# Patient Record
Sex: Female | Born: 1965 | Race: White | Hispanic: No
Health system: Midwestern US, Community
[De-identification: ages and names within clinical notes are randomized; demographics above are authoritative.]

---

## 2016-06-13 MED ORDER — CIPROFLOXACIN IN 5 % DEXTROSE 400 MG/200 ML IV PGBK
400 mg | Freq: Two times a day (BID) | INTRAVENOUS | 0 refills | Status: DC
Start: 2016-06-13 — End: 2016-06-14

## 2016-06-13 MED ORDER — DICYCLOMINE 10 MG/ML IM SOLN
20 mg | Freq: Once | INTRAMUSCULAR | 0 refills | Status: CP
Start: 2016-06-13 — End: ?

## 2016-06-13 MED ORDER — FENTANYL CITRATE (PF) 50 MCG/ML IJ SOLN
50 ug | Freq: Once | INTRAVENOUS | 0 refills | Status: CP
Start: 2016-06-13 — End: ?

## 2016-06-13 MED ORDER — LEVOFLOXACIN IN D5W 750 MG/150 ML IV PGBK
750 mg | INTRAVENOUS | 0 refills | Status: CN
Start: 2016-06-13 — End: ?

## 2016-06-13 MED ORDER — LACTATED RINGERS IV SOLP
1000 mL | Freq: Once | INTRAVENOUS | 0 refills | Status: CP
Start: 2016-06-13 — End: ?

## 2016-06-13 MED ORDER — MORPHINE 2 MG/ML IV CRTG
2 mg | Freq: Once | INTRAVENOUS | 0 refills | Status: CP
Start: 2016-06-13 — End: ?

## 2016-06-13 MED ORDER — SODIUM CHLORIDE 0.9 % IJ SOLN
50 mL | Freq: Once | INTRAVENOUS | 0 refills | Status: CP
Start: 2016-06-13 — End: ?

## 2016-06-13 MED ORDER — IOPAMIDOL 76 % IV SOLN
100 mL | Freq: Once | INTRAVENOUS | 0 refills | Status: CP
Start: 2016-06-13 — End: ?

## 2016-06-13 MED ORDER — SIMVASTATIN 40 MG PO TAB
40 mg | Freq: Every evening | ORAL | 0 refills | Status: DC
Start: 2016-06-13 — End: 2016-06-14

## 2016-06-13 MED ORDER — PANTOPRAZOLE 40 MG PO TBEC
40 mg | Freq: Every day | ORAL | 0 refills | Status: DC
Start: 2016-06-13 — End: 2016-06-14

## 2016-06-13 MED ORDER — METRONIDAZOLE IN NACL (ISO-OS) 500 MG/100 ML IV PGBK
500 mg | Freq: Once | INTRAVENOUS | 0 refills | Status: CP
Start: 2016-06-13 — End: ?

## 2016-06-13 MED ORDER — SODIUM CHLORIDE 0.9 % IV SOLP
1000 mL | Freq: Once | INTRAVENOUS | 0 refills | Status: CP
Start: 2016-06-13 — End: ?

## 2016-06-13 MED ORDER — CIPROFLOXACIN HCL 500 MG PO TAB
500 mg | Freq: Two times a day (BID) | ORAL | 0 refills | Status: DC
Start: 2016-06-13 — End: 2016-06-13

## 2016-06-13 MED ORDER — FERROUS SULFATE 325 MG (65 MG IRON) PO TAB
325 mg | Freq: Every day | ORAL | 0 refills | Status: DC
Start: 2016-06-13 — End: 2016-06-14

## 2016-06-13 MED ORDER — ONDANSETRON HCL (PF) 4 MG/2 ML IJ SOLN
4 mg | INTRAVENOUS | 0 refills | Status: DC | PRN
Start: 2016-06-13 — End: 2016-06-14

## 2016-06-13 MED ORDER — METRONIDAZOLE IN NACL (ISO-OS) 500 MG/100 ML IV PGBK
500 mg | INTRAVENOUS | 0 refills | Status: DC
Start: 2016-06-13 — End: 2016-06-14

## 2016-06-13 MED ORDER — FAMOTIDINE 20 MG PO TAB
20 mg | Freq: Two times a day (BID) | ORAL | 0 refills | Status: DC
Start: 2016-06-13 — End: 2016-06-13

## 2016-06-13 MED ORDER — DULOXETINE 60 MG PO CPDR
120 mg | Freq: Every day | ORAL | 0 refills | Status: DC
Start: 2016-06-13 — End: 2016-06-14

## 2016-06-13 MED ORDER — CIPROFLOXACIN HCL 500 MG PO TAB
500 mg | Freq: Once | ORAL | 0 refills | Status: CP
Start: 2016-06-13 — End: ?

## 2016-06-13 MED ORDER — MORPHINE 2 MG/ML IV CRTG
1-2 mg | INTRAVENOUS | 0 refills | Status: DC | PRN
Start: 2016-06-13 — End: 2016-06-14

## 2016-06-14 MED ORDER — POLYETHYLENE GLYCOL 3350 17 GRAM/DOSE PO POWD
17 g | Freq: Every day | ORAL | 0 refills | Status: CN | PRN
Start: 2016-06-14 — End: ?

## 2016-06-14 MED ORDER — PEG-ELECTROLYTE SOLN 420 GRAM PO SOLR
4 L | Freq: Once | ORAL | 0 refills | Status: DC
Start: 2016-06-14 — End: 2016-06-14

## 2016-06-14 MED ORDER — TRAMADOL 50 MG PO TAB
50 mg | ORAL | 0 refills | Status: DC | PRN
Start: 2016-06-14 — End: 2016-06-14

## 2016-08-04 ENCOUNTER — Inpatient Hospital Stay
Admit: 2016-08-04 | Discharge: 2016-08-05 | Discharge status: Against Medical Advice | Payer: Commercial Managed Care - PPO

## 2016-08-04 DIAGNOSIS — R1032 Left lower quadrant pain: Secondary | ICD-10-CM

## 2016-08-04 DIAGNOSIS — K921 Melena: Principal | ICD-10-CM

## 2016-08-04 DIAGNOSIS — K51919 Ulcerative colitis, unspecified with unspecified complications: ICD-10-CM

## 2016-08-04 MED ORDER — ALPRAZOLAM 1 MG PO TAB
1 mg | Freq: Two times a day (BID) | ORAL | 0 refills | Status: DC | PRN
Start: 2016-08-04 — End: 2016-08-05
  Administered 2016-08-05: 13:00:00 1 mg via ORAL

## 2016-08-04 MED ORDER — DICYCLOMINE 20 MG PO TAB
20 mg | ORAL | 0 refills | Status: DC
Start: 2016-08-04 — End: 2016-08-05
  Administered 2016-08-05 (×2): 20 mg via ORAL

## 2016-08-04 MED ORDER — ONDANSETRON 4 MG PO TBDI
4 mg | ORAL | 0 refills | Status: DC | PRN
Start: 2016-08-04 — End: 2016-08-05

## 2016-08-04 MED ORDER — PEG-ELECTROLYTE SOLN 420 GRAM PO SOLR
4 L | ORAL | 0 refills | Status: DC
Start: 2016-08-04 — End: 2016-08-05

## 2016-08-04 MED ORDER — HYDROMORPHONE 2 MG PO TAB
2-4 mg | ORAL | 0 refills | Status: DC | PRN
Start: 2016-08-04 — End: 2016-08-05
  Administered 2016-08-05 (×2): 4 mg via ORAL

## 2016-08-04 MED ORDER — SODIUM CHLORIDE 0.9 % IV SOLP
INTRAVENOUS | 0 refills | Status: AC
Start: 2016-08-04 — End: ?
  Administered 2016-08-05: 07:00:00 1000.000 mL via INTRAVENOUS

## 2016-08-04 MED ORDER — ACETAMINOPHEN 325 MG PO TAB
650 mg | ORAL | 0 refills | Status: DC | PRN
Start: 2016-08-04 — End: 2016-08-05

## 2016-08-04 MED ORDER — HYDROCORTISONE ACETATE 10 % (80 MG) RE FOAM
Freq: Two times a day (BID) | RECTAL | 0 refills | Status: DC
Start: 2016-08-04 — End: 2016-08-04

## 2016-08-04 MED ORDER — LIDOCAINE HCL 2 % MM JELP
TOPICAL | 0 refills | Status: DC | PRN
Start: 2016-08-04 — End: 2016-08-04

## 2016-08-04 MED ORDER — BISACODYL 5 MG PO TBEC
10 mg | Freq: Once | ORAL | 0 refills | Status: AC | PRN
Start: 2016-08-04 — End: ?

## 2016-08-04 MED ORDER — HYDROMORPHONE 2 MG PO TAB
2-4 mg | ORAL | 0 refills | Status: DC | PRN
Start: 2016-08-04 — End: 2016-08-05

## 2016-08-04 MED ORDER — FERROUS SULFATE 325 MG (65 MG IRON) PO TAB
325 mg | Freq: Every day | ORAL | 0 refills | Status: DC
Start: 2016-08-04 — End: 2016-08-05

## 2016-08-04 MED ORDER — DULOXETINE 60 MG PO CPDR
120 mg | Freq: Every evening | ORAL | 0 refills | Status: DC
Start: 2016-08-04 — End: 2016-08-05

## 2016-08-04 MED ORDER — PEG-ELECTROLYTE SOLN 420 GRAM PO SOLR
2 L | ORAL | 0 refills | Status: DC | PRN
Start: 2016-08-04 — End: 2016-08-04

## 2016-08-04 MED ORDER — ONDANSETRON 8 MG PO TBDI
8 mg | Freq: Once | ORAL | 0 refills | Status: CP
Start: 2016-08-04 — End: ?

## 2016-08-04 MED ORDER — HYDROMORPHONE (PF) 2 MG/ML IJ SYRG
1 mg | Freq: Once | INTRAVENOUS | 0 refills | Status: CP
Start: 2016-08-04 — End: ?

## 2016-08-04 MED ORDER — SODIUM CHLORIDE 0.9 % IV SOLP
1000 mL | Freq: Once | INTRAVENOUS | 0 refills | Status: CP
Start: 2016-08-04 — End: ?

## 2016-08-04 MED ORDER — BISACODYL 5 MG PO TBEC
10 mg | Freq: Once | ORAL | 0 refills | Status: DC | PRN
Start: 2016-08-04 — End: 2016-08-04

## 2016-08-04 MED ORDER — OXYCODONE 5 MG PO TAB
5-10 mg | ORAL | 0 refills | Status: DC | PRN
Start: 2016-08-04 — End: 2016-08-04

## 2016-08-04 MED ORDER — PANTOPRAZOLE 40 MG PO TBEC
40 mg | Freq: Every day | ORAL | 0 refills | Status: DC
Start: 2016-08-04 — End: 2016-08-05

## 2016-08-04 MED ORDER — SIMVASTATIN 40 MG PO TAB
40 mg | Freq: Every evening | ORAL | 0 refills | Status: DC
Start: 2016-08-04 — End: 2016-08-05

## 2016-08-04 MED ORDER — FENTANYL CITRATE (PF) 50 MCG/ML IJ SOLN
50 ug | Freq: Once | INTRAVENOUS | 0 refills | Status: CP
Start: 2016-08-04 — End: ?

## 2016-08-04 MED ORDER — PEG-ELECTROLYTE SOLN 420 GRAM PO SOLR
4 L | ORAL | 0 refills | Status: DC
Start: 2016-08-04 — End: 2016-08-04

## 2016-08-04 MED ORDER — MELATONIN 5 MG PO TAB
5 mg | Freq: Every evening | ORAL | 0 refills | Status: DC | PRN
Start: 2016-08-04 — End: 2016-08-05

## 2016-08-04 MED ORDER — HYDROMORPHONE 2 MG PO TAB
2-4 mg | ORAL | 0 refills | Status: DC | PRN
Start: 2016-08-04 — End: 2016-08-04

## 2016-08-04 MED ORDER — PEG-ELECTROLYTE SOLN 420 GRAM PO SOLR
2 L | ORAL | 0 refills | Status: DC | PRN
Start: 2016-08-04 — End: 2016-08-05

## 2016-08-04 MED ORDER — MESALAMINE 4 GRAM/60 ML RE ENEM
4 g | Freq: Every evening | RECTAL | 0 refills | Status: DC
Start: 2016-08-04 — End: 2016-08-04

## 2016-08-04 MED ORDER — PANTOPRAZOLE 40 MG IV SOLR
80 mg | Freq: Once | INTRAVENOUS | 0 refills | Status: CP
Start: 2016-08-04 — End: ?

## 2016-08-04 NOTE — Progress Notes
Patient arrived on unit via wheelchair. Patient transferred to bed without assistance. Assessment completed, refer to flowsheet for details. Orders released, reviewed, and implemented as appropriate. Oriented to surroundings, call light within reach. Plan of care reviewed.  Will continue to monitor and assess.

## 2016-08-04 NOTE — Progress Notes
Upon arriving to the unit RN was notified that patient would not go in a semi-private room. Patient states "I don't want to be doing a bowel prep with a roommate".  RN informed the patient we could move her to another semi-private that is empty for now but if the unit fills up she still has the possibility of having a roommate at some point. Patient voiced understanding.

## 2016-08-04 NOTE — ED Notes
Lab called report blue top tube clotted and needs redrawn and reordered for pt/aptt.

## 2016-08-04 NOTE — H&P (View-Only)
Admission History and Physical Examination      Name:  Tanya French                                             MRN:  [id number]   Admission Date:  08/04/2016                     Assessment/Plan:    Tanya French???is a 51 y.o.???female???with a past medical history significant for recurrent rectal ulcerations, HLD, GERD, Obesity, and anxiety/depression who presented with a 1 day history of LLQ and rectal pain and small volume bright red blood per rectum  ???  Recurrent rectal ulcerations  LLQ abdominal pain  Rectal bleeding  - Patient has had recurrent rectal ulcerations. No diagnosis of IBD.  - Patient notes 2-3 exacerbations of her rectal ulcers this year.  Most recently in April.   - Patient uses hydrocortisone and mesalamine supp. for treatment of rectal ulcerations previously with good response  - Patient does not follow with outpatient GI doctor  - Most recent colonoscopy reportedly at [facility] hospital in April 2018  - Colonoscopy (03/2013): 2 cm rectal mass with central ulceration. Pathology of rectal mass showed focal adenomatous changes in background of marked acute and chronic inflammation, mucosal ulceration, necrosis and fibrinopurulent exudate. Negative for CMV, HSV-I/II  - Flex sig 03/19/2016 showed 2-3 cm area in distal rectum with multiple shallow ulcers that involved 75% circumference of the rectum with places of spared inter-ulcer mucosa. Rectal biopsy showed ulceration with fibrinopurulent exudate, cryptitis and atrophic glands. Hyperplastic changes. Negative for malignancy.   - CT A/P (06/13/2016): Mild persistent nonspecific soft tissue thickening of the rectum with mild perirectal fat stranding. Findings are nonspecific and may be from inflammation. Rectal mass could potentially produce this appearance.   - Of note: Patient has had multiple admissions for similar complaints, but has history of leaving AMA when not given IV pain medications. > Obtain ESR/CRP  > GI c/s. Discussed case with fellow.  Will hold mesalamine supp and hydrocortisone rectal foam for time being until after colonoscopy  > Prep for colonoscopy with Go-Lytely overnight. CLD today. NPO at midnight. Plan for colonoscopy on 6/1.  > Q8H H/H, transfuse for Hgb <7g/dL  > pain control with PO hydromorphone 2-4mg  Q6H PRN (changed from oxycodone as patient claims oxycodone doesn't work for me).  > Have requested records from [facility] hospital re: recent colonoscopy    Anemia suspected secondary to iron-deficiency   - Iron studies (06/2016): High TIBC and low iron sat. Low normal ferritin likely secondary to role as acute inflammatory marker  > Continue PTA ferrous sulfate TIDWM  ???  HLD  > Continue PTA simvastatin  ???  Depression/Anxiety  > Continue PTA duloxetine  ???  GERD  > Continue PTA PPI???    Hyperglycemia  - Recent Hgb A1c of 6.5 on 06/13/16  > Obtain Hgb A1c.     History of leaving AMA  Concern for IV drug seeking behavior  - Patient has had multiple admissions for similar complaints, but has history of leaving AMA when not given IV pain medications.  - Patient displayed some concerning behaviors in ER.  When told she would be starting with oral pain medications, she stated oral pain medications don't work for me [...] That is all you are going to give me?; she  then became tearful. I reassured her we would treat her pain, but that there was no indication for IV pain medication at this time as we have not even attempted oral pain medications yet.     FEN:  Fluids: NS @100cc /hr  Electrolytes: WNL. Replete PRN  Nutrition: CLD --> NPO at midnight    Indwelling Lines/Catheters/Drains  Central line/urinary catheter?: None  Drains?: None    Prophylaxis, Code, Dispo:  VTE Ppx- Holding pharm ppx due to rectal bleeding. SCDs.   CODE: FULL CODE - discussed on admission.   Dispo: Admit to medicine, inpatient status.     [name], MD   Pager: [phone]  Med [facility]- [id number] __________________________________________________________________________________  Primary Care Physician: [facility]    Chief Complaint:  Rectal pain, bright red blood per rectum    History of Present Illness: Tanya French is a 51 y.o. female with medical history of recurrent rectal ulcerations, hyperlipidemia, depression/anxiety, and GERD who presents to the ER today with complaints of rectal pain and bright red blood per rectum.    Patient has a long history of rectal ulcerations, approximately 3 per year.  She has never been given a diagnosis of ulcerative colitis or plantar bowel disease.  Presents today after 2 days of rectal pain and bright red blood per rectum.  Patient describes bloody streaks on formed stool for last 2 days.  Notes pain with defecation and feeling of incomplete emptying.  She has had similar symptoms in the past with recurrent rectal ulcerations.  She has been seen at [facility] previously with flexible sigmoidoscopy in January of this year showing multiple shallow ulcerations comprising about 70% of circumference of rectum.  She has previously used hydrocortisone suppositories and mesalamine suppositories with good resolution of ulcerations and associated rectal pain.  Patient does have history of leaving AMA when not given IV pain medications, and there is some concern for IV drug seeking behavior.    Patient denies fevers, chills, shortness of breath, chest pain, lightheadedness, dizziness, nausea, vomiting, or change in urination.  She notes pain with defecation, left lower quadrant pain worsened with palpation, and blood streaks admixed with stool.    In the ER, initial labs were largely unremarkable with hemoglobin stable from prior hospitalization at 9.7 g/dL, no leukocytosis, and normal chemistry panel.  Blood glucose was elevated at 176.  Hemoccult was positive.  Medicine was contacted for admission.      Past Medical/Surgical History:  Past Medical History: Diagnosis Date   ??? Anemia    ??? Anxiety    ??? GERD (gastroesophageal reflux disease)    ??? Hepatic steatosis    ??? Hyperlipidemia    ??? Periumbilical hernia    ??? Rectal ulcer    ??? 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 Sigmoid diverticulosis      Past Surgical History:   Procedure Laterality Date   ??? ACL RECONSTRUCTION  2008   ??? HYSTERECTOMY  2009    R oophorectomy   ??? APPENDECTOMY  2009   ??? CHOLECYSTECTOMY  2009   ??? OOPHORECTOMY  2010    L oophorectomy   ??? CARPAL TUNNEL RELEASE  2012   ??? PR SIGMOIDOSCOPY FLX DX W/COLLJ SPEC BR/WA IF PFRMD N/A 03/20/2015    SIGMOIDOSCOPY DIAGNOSTIC performed by Tim Lair, MD at [facility]   ??? PR SIGMOIDOSCOPY FLX W/BIOPSY SINGLE/MULTIPLE  03/20/2015    SIGMOIDOSCOPY BIOPSY performed by Tim Lair, MD  at [facility]   ??? PLANTAR FASCIA SURGERY         Family History:  Family History   Problem Relation Age of Onset   ??? Cancer Maternal Grandmother      colon CA at age 5   ??? Diabetes Father    ??? Heart Attack Father    ??? Hypertension Father    ??? Other Father      arrhythmias   ??? Hypertension Mother    ??? Other Mother      COPD       Social History:  Social History     Social History   ??? Marital status: Divorced     Spouse name: N/A   ??? Number of children: N/A   ??? Years of education: N/A     Social History Main Topics   ??? Smoking status: Current Every Day Smoker     Packs/day: 0.50     Years: 20.00     Types: Cigarettes   ??? Smokeless tobacco: Never Used   ??? Alcohol use Yes      Comment: social   ??? Drug use: No   ??? Sexual activity: Not on file     Other Topics Concern   ??? Not on file     Social History Narrative   ??? No narrative on file        Immunizations (includes history and patient reported):   There is no immunization history on file for this patient.        Allergies:  Toradol [ketorolac]; Keflex [cephalexin]; and Compazine [prochlorperazine]    Medications:  Prescriptions Prior to Admission   Medication Sig ??? ALPRAZolam XR(+) (XANAX XR) 2 mg tablet Take 2 mg by mouth every morning.   ??? dicyclomine (BENTYL) 20 mg tablet Take 1 Tab by mouth every 6 hours.   ??? duloxetine DR (CYMBALTA) 60 mg capsule Take 120 mg by mouth at bedtime daily.   ??? ferrous sulfate (FEOSOL, FEROSUL) 325 mg (65 mg iron) tablet Take 1 Tab by mouth daily.   ??? lansoprazole DR (PREVACID) 30 mg PO capsule Take 30 mg by mouth twice daily.   ??? ondansetron (ZOFRAN ODT) 4 mg rapid dissolve tablet Dissolve 1 Tab by mouth every 8 hours as needed for Nausea or Vomiting. Place on tongue to disolve.   ??? polyethylene glycol 3350 (GLYCOLAX; MIRALAX) 17 gram/dose powder Take 17 g by mouth daily. (Patient taking differently: Take 17 g by mouth daily as needed.)   ??? simvastatin (ZOCOR) 40 mg PO tablet Take 40 mg by mouth at bedtime daily.       Review of Systems:  All other systems reviewed and are negative.    Vital Signs:  Vital Signs: Last Filed In 24 Hours Vital Signs: 24 Hour Range   BP: 148/104 (05/31 1204)  Temp: 36.8 ???C (98.2 ???F) (05/31 0810)  Respirations: 18 PER MINUTE (05/31 0810)  SpO2: 95 % (05/31 1204)  O2 Delivery: None (Room Air) (05/31 0810)  SpO2 Pulse: 99 (05/31 1204)  Height: 162.6 cm (64) (05/31 0810) BP: (136-148)/(88-108)   Temp:  [36.8 ???C (98.2 ???F)]   Respirations:  [18 PER MINUTE]   SpO2:  [95 %-96 %]   O2 Delivery: None (Room Air)   Intensity Pain Scale 0-10 (Pain 1): 7 (08/04/16 1610)      Physical Exam:  General/Constitutional: Obese caucasian female. NAD.   Eyes: PERRL. EOMI  ENT: Oropharynx clear. No mucosal lesions.   Cardiovascular: RRR. No c/g/r/m.  S1/S2 normal. No LE edema  Pulmonary: Lungs clear to ascultation. No wheezes or crackles. No acc muscle use or inc work of breathing  GI: Abdomen soft. Tender to palpation in LLQ and RLQ (LLQ>>RLQ). Non-distended. Normoactive BS  Skin: Warm. Dry. No rashes.   Vascular: 2+ peripheral pulses in bilateral radial and DP.   Neuro: CN intact. No focal deficits Psych: Alert and oriented x4. Mood and affect positive and congruent. Judgment and insight appear intact.         Labs:  Results for orders placed or performed during the hospital encounter of 08/04/16 (from the past 24 hour(s))   CBC AND DIFF    Collection Time: 08/04/16  8:20 AM   Result Value Ref Range    White Blood Cells 4.7 4.5 - 11.0 K/UL    RBC 3.66 (L) 4.0 - 5.0 M/UL    Hemoglobin 9.7 (L) 12.0 - 15.0 GM/DL    Hematocrit 24.4 (L) 36 - 45 %    MCV 79.9 (L) 80 - 100 FL    MCH 26.4 26 - 34 PG    MCHC 33.0 32.0 - 36.0 G/DL    RDW 01.0 (H) 11 - 15 %    Platelet Count 243 150 - 400 K/UL    MPV 7.4 7 - 11 FL    Neutrophils 80 (H) 41 - 77 %    Lymphocytes 9 (L) 24 - 44 %    Monocytes 6 4 - 12 %    Eosinophils 4 0 - 5 %    Basophils 1 0 - 2 %    Absolute Neutrophil Count 3.70 1.8 - 7.0 K/UL    Absolute Lymph Count 0.40 (L) 1.0 - 4.8 K/UL    Absolute Monocyte Count 0.30 0 - 0.80 K/UL    Absolute Eosinophil Count 0.20 0 - 0.45 K/UL    Absolute Basophil Count 0.00 0 - 0.20 K/UL   COMPREHENSIVE METABOLIC PANEL    Collection Time: 08/04/16  8:20 AM   Result Value Ref Range    Sodium 138 137 - 147 MMOL/L    Potassium 4.1 3.5 - 5.1 MMOL/L    Chloride 105 98 - 110 MMOL/L    Glucose 176 (H) 70 - 100 MG/DL    Blood Urea Nitrogen 11 7 - 25 MG/DL    Creatinine 2.72 0.4 - 1.00 MG/DL    Calcium 9.1 8.5 - 53.6 MG/DL    Total Protein 6.9 6.0 - 8.0 G/DL    Total Bilirubin 0.3 0.3 - 1.2 MG/DL    Albumin 4.0 3.5 - 5.0 G/DL    Alk Phosphatase 644 25 - 110 U/L    AST (SGOT) 18 7 - 40 U/L    CO2 24 21 - 30 MMOL/L    ALT (SGPT) 18 7 - 56 U/L    Anion Gap 9 3 - 12    eGFR Non African American >60 >60 mL/min    eGFR African American >60 >60 mL/min   LIPASE    Collection Time: 08/04/16  8:20 AM   Result Value Ref Range    Lipase 23 11 - 82 U/L   TYPE & CROSSMATCH    Collection Time: 08/04/16  8:20 AM   Result Value Ref Range    Units Ordered 0     Crossmatch Expires 08/07/2016     Record Check FOUND     ABO/RH(D) O POS Antibody Screen NEG     Electronic Crossmatch YES    POC LACTATE  Collection Time: 08/04/16  8:53 AM   Result Value Ref Range    LACTIC ACID POC 2.3 (H) 0.5 - 2.0 MMOL/L   URINALYSIS DIPSTICK    Collection Time: 08/04/16  9:10 AM   Result Value Ref Range    Color,UA STRAW     Turbidity,UA CLEAR CLEAR-CLEAR    Specific Gravity-Urine 1.008 1.003 - 1.035    pH,UA 6.0 5.0 - 8.0    Protein,UA NEG NEG-NEG    Glucose,UA NEG NEG-NEG    Ketones,UA NEG NEG-NEG    Bilirubin,UA NEG NEG-NEG    Blood,UA NEG NEG-NEG    Urobilinogen,UA NORMAL NORM-NORMAL    Nitrite,UA NEG NEG-NEG    Leukocytes,UA NEG NEG-NEG    Urine Ascorbic Acid, UA NEG NEG-NEG   URINALYSIS, MICROSCOPIC    Collection Time: 08/04/16  9:10 AM   Result Value Ref Range    WBCs,UA 0-2 0 - 2 /HPF    RBCs,UA 0-2 0 - 3 /HPF    Squamous Epithelial Cells 0-2 0 - 5   PTT (APTT)    Collection Time: 08/04/16  9:58 AM   Result Value Ref Range    APTT 24.8 21.0 - 39.0 SEC   PROTIME INR (PT)    Collection Time: 08/04/16  9:58 AM   Result Value Ref Range    INR 0.9 0.8 - 1.2     Glucose: (!) 176 (08/04/16 0820)  Hemoccult: (S) Positive (Per Dr. Chestine Spore) (08/04/16 1610)    Radiology/Diagnostic Testing:  Pertinent radiology reviewed.

## 2016-08-04 NOTE — ED Notes
IP Bed assinged: BH [id number]. READY. please call Clarisse GougeBridget for report @ [phone].

## 2016-08-04 NOTE — Consults
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  GI were consulted for further evaluation and management.    ??? Flexible sigmoidoscopy from January 2017 shows distal 2-3 cm of rectum with multiple shallow ulcers covering 75% of the circumference of the rectum.  Biopsies showed ulcerations with fibrinopurulent exudate, cryptitis and atrophic glands, no dysplasia.  ??? [name]  Intermittently she would use mesalamine enemas or suppositories and prednisone with good control of pain and bleeding  ??? Multiple previous presentations and admissions for same complaint, ended with patient leaving AMA  ??? Hemoglobin of 9.7 on presentation, around baseline  ??? No abdominal imaging during this admission  ??? Rectal exam without any fissures    Recommendations:  1. Intravascular resuscitation with crystalloids and packed RBC per primary team as indicated, goal hemoglobin of 7  2. Pain control per primary team  3. Consider abdominal imaging with CT to further evaluate this severe reported lower abdominal pain  4. Tentative plan for colonoscopy tomorrow  5. Please prep bowel with GoLYTELY per order set  6. Keep patient n.p.o. after midnight  7. Further recommendation will follow endoscopic evaluation/intervention      Evaluated and discussed with my attending physician Dr. Camillo Flaming  ______________________________________________________________________    History of Present Illness: Tanya French is a 51 y.o. 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  Patient reports severe rectal pain for the past 2 days associated with rectal bleeding since yesterday.  She admits that this is not the first time she has those episodes.  She reports that she occasionally uses mesalamine suppositories or enemas with good control of symptoms, rectal pain and bleeding.  She denies any nausea or vomiting.  She admits not to following up in GI clinic with any provider due to insurance issues.  She was recently admitted April of this year with similar complaints and left AMA.    Past Medical History:   Diagnosis Date   ??? Anemia    ??? Anxiety    ??? GERD (gastroesophageal reflux disease)    ??? Hepatic steatosis    ??? Hyperlipidemia    ??? Periumbilical hernia    ??? Rectal ulcer    ??? 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 Sigmoid diverticulosis      Past Surgical History:   Procedure Laterality Date   ??? ACL RECONSTRUCTION  2008   ??? HYSTERECTOMY  2009    R oophorectomy   ??? APPENDECTOMY  2009   ??? CHOLECYSTECTOMY  2009   ??? OOPHORECTOMY  2010    L oophorectomy   ??? CARPAL TUNNEL RELEASE  2012   ??? PR SIGMOIDOSCOPY FLX DX W/COLLJ SPEC BR/WA IF PFRMD N/A 03/20/2015    SIGMOIDOSCOPY DIAGNOSTIC performed by Tim Lair, MD at [facility]   ??? PR SIGMOIDOSCOPY FLX W/BIOPSY SINGLE/MULTIPLE  03/20/2015    SIGMOIDOSCOPY BIOPSY performed by Tim Lair, MD at [facility]   ??? PLANTAR FASCIA SURGERY       Social History     Social History   ??? Marital status: Divorced     Spouse name: N/A   ??? Number of children: N/A   ??? Years of education: N/A     Social History Main Topics   ??? Smoking status: Current Every Day Smoker     Packs/day: 0.50     Years: 20.00     Types: Cigarettes   ??? Smokeless tobacco: Never Used   ??? Alcohol use Yes      Comment: social ??? Drug use: No   ??? Sexual activity: Not on file     Other Topics Concern   ??? Not on file     Social History Narrative   ??? No narrative on file     Family History   Problem Relation Age of Onset   ??? Cancer Maternal Grandmother      colon CA at age 81   ??? Diabetes Father    ??? Heart Attack Father    ??? Hypertension Father    ??? Other Father      arrhythmias   ??? Hypertension Mother    ??? Other Mother      COPD     Allergies:  Toradol [ketorolac]; Keflex [cephalexin]; and Compazine [prochlorperazine]    Scheduled Meds: Continuous Infusions:  PRN and Respiratory Meds:lidocaine PRN    Review of Systems:  All other systems reviewed and are negative.  Vital Signs:  Last Filed in 24 hours Vital Signs:  24 hour Range    BP: 148/104 (05/31 1204)  Temp: 36.8 ???C (98.2 ???F) (05/31 0810)  Respirations: 18 PER MINUTE (05/31 0810)  SpO2: 95 % (05/31 1204)  O2 Delivery: None (Room Air) (05/31 0810)  SpO2 Pulse: 99 (05/31 1204)  Height: 162.6 cm (64) (05/31 0810) BP: (136-148)/(88-108)   Temp:  [36.8 ???C (98.2 ???F)]   Respirations:  [18 PER MINUTE]   SpO2:  [95 %-96 %]   O2 Delivery: None (Room Air)     Physical Exam:  Vitals:    08/04/16 1204   BP: (!) 148/104   Temp:    SpO2: 95%       General: Awake, alert, oriented ???3, in distress  HEENT: Moist mucosal membranes, anicteric sclera  Neck: Supple, no JVD  Lymph: No cervical or supraclavicular lymphadenopathy  Heart: Regular rate and rhythm, intact distal pulses  Lungs: Breathing comfortably on room air, no respiratory distress  Abdomen: Soft, nontender, nondistended, normal bowel sounds  Lower extremities: No edema, good pulses bilaterally  Neurologic: Grossly intact, no focal deficits  Skin: Warm, dry  Rectal examination: Inspection did not show any fissures or abscesses    Lab/Radiology/Other Diagnostic Tests:  24-hour labs:    Results for orders placed or performed during the hospital encounter of 08/04/16 (from the past 24 hour(s))   CBC AND DIFF    Collection Time: 08/04/16  8:20 AM Result Value Ref Range    White Blood Cells 4.7 4.5 - 11.0 K/UL    RBC 3.66 (L) 4.0 - 5.0 M/UL    Hemoglobin 9.7 (L) 12.0 - 15.0 GM/DL    Hematocrit 16.1 (L)  36 - 45 %    MCV 79.9 (L) 80 - 100 FL    MCH 26.4 26 - 34 PG    MCHC 33.0 32.0 - 36.0 G/DL    RDW 60.4 (H) 11 - 15 %    Platelet Count 243 150 - 400 K/UL    MPV 7.4 7 - 11 FL    Neutrophils 80 (H) 41 - 77 %    Lymphocytes 9 (L) 24 - 44 %    Monocytes 6 4 - 12 %    Eosinophils 4 0 - 5 %    Basophils 1 0 - 2 %    Absolute Neutrophil Count 3.70 1.8 - 7.0 K/UL    Absolute Lymph Count 0.40 (L) 1.0 - 4.8 K/UL    Absolute Monocyte Count 0.30 0 - 0.80 K/UL    Absolute Eosinophil Count 0.20 0 - 0.45 K/UL    Absolute Basophil Count 0.00 0 - 0.20 K/UL   COMPREHENSIVE METABOLIC PANEL    Collection Time: 08/04/16  8:20 AM   Result Value Ref Range    Sodium 138 137 - 147 MMOL/L    Potassium 4.1 3.5 - 5.1 MMOL/L    Chloride 105 98 - 110 MMOL/L    Glucose 176 (H) 70 - 100 MG/DL    Blood Urea Nitrogen 11 7 - 25 MG/DL    Creatinine 5.40 0.4 - 1.00 MG/DL    Calcium 9.1 8.5 - 98.1 MG/DL    Total Protein 6.9 6.0 - 8.0 G/DL    Total Bilirubin 0.3 0.3 - 1.2 MG/DL    Albumin 4.0 3.5 - 5.0 G/DL    Alk Phosphatase 191 25 - 110 U/L    AST (SGOT) 18 7 - 40 U/L    CO2 24 21 - 30 MMOL/L    ALT (SGPT) 18 7 - 56 U/L    Anion Gap 9 3 - 12    eGFR Non African American >60 >60 mL/min    eGFR African American >60 >60 mL/min   LIPASE    Collection Time: 08/04/16  8:20 AM   Result Value Ref Range    Lipase 23 11 - 82 U/L   TYPE & CROSSMATCH    Collection Time: 08/04/16  8:20 AM   Result Value Ref Range    Units Ordered 0     Crossmatch Expires 08/07/2016     Record Check FOUND     ABO/RH(D) O POS     Antibody Screen NEG     Electronic Crossmatch YES    POC LACTATE    Collection Time: 08/04/16  8:53 AM   Result Value Ref Range    LACTIC ACID POC 2.3 (H) 0.5 - 2.0 MMOL/L   URINALYSIS DIPSTICK    Collection Time: 08/04/16  9:10 AM   Result Value Ref Range    Color,UA STRAW Turbidity,UA CLEAR CLEAR-CLEAR    Specific Gravity-Urine 1.008 1.003 - 1.035    pH,UA 6.0 5.0 - 8.0    Protein,UA NEG NEG-NEG    Glucose,UA NEG NEG-NEG    Ketones,UA NEG NEG-NEG    Bilirubin,UA NEG NEG-NEG    Blood,UA NEG NEG-NEG    Urobilinogen,UA NORMAL NORM-NORMAL    Nitrite,UA NEG NEG-NEG    Leukocytes,UA NEG NEG-NEG    Urine Ascorbic Acid, UA NEG NEG-NEG   URINALYSIS, MICROSCOPIC    Collection Time: 08/04/16  9:10 AM   Result Value Ref Range    WBCs,UA 0-2 0 - 2 /HPF    RBCs,UA 0-2  0 - 3 /HPF    Squamous Epithelial Cells 0-2 0 - 5   PTT (APTT)    Collection Time: 08/04/16  9:58 AM   Result Value Ref Range    APTT 24.8 21.0 - 39.0 SEC   PROTIME INR (PT)    Collection Time: 08/04/16  9:58 AM   Result Value Ref Range    INR 0.9 0.8 - 1.2     Pertinent radiology reviewed.      Sheran Lawless, MD  Gastroenterology & Hepatology Fellow  Pager  [phone]

## 2016-08-04 NOTE — Progress Notes
Patient requesting medication for pain. Patient currently has PO oxycodone and tylenol PRN. Patient stated "I told the doctor downstairs that oxycodone doesn't work for me. He said that I wound have something IV for breakthrough pain". This RN educated patient that the first line of pain medication is oral. IV medication is given for breakthrough pain if the oral pills feel like they aren't working but the oral pills will always be given first. This RN told the patient that I would call the doctor and let them know her concerns. Patient also does not have any diet order and patients BP is 148/104 at 1204.     Spoke with Dr. Melbourne AbtsJanish about patients concerns. Melbourne AbtsJanish stated that he could change her oral pills to a different pain medication but he does not want to add IV pain medication. Dr. Melbourne AbtsJanish stated that it would be okay for the patient to have a regular diet. Dr. Melbourne AbtsJanish was also informed of the patient elevated BP. He stated to just monitor. Nothing further at this time.

## 2016-08-04 NOTE — ED Notes
Tanya French is a 51 y/o female who presents to the ED with CC of rectal bleeding. She reports bright red blood in her toilet and on the toilet paper this morning and last night. She denies chest pain, SOA, dizziness or light headedness. She describes her pain as "It's hard to describe. It feels like my rectum and lower abdomen are ripping apart."   Pt placed on ED monitor. Airway is patent. Lungs CTA.  Pt is PWD. NAD noted. Pt is alert and oriented*4. Pt ambulatory with steady gait.     Pt Belongings:   Clothing: gray shirt, black sweat pants, undergarments, shoes   MISC: phone, $4,    Belongings placed in bag at bedside or are with pt.

## 2016-08-05 ENCOUNTER — Encounter: Admit: 2016-08-05 | Discharge: 2016-08-05 | Payer: Commercial Managed Care - PPO

## 2016-08-05 ENCOUNTER — Inpatient Hospital Stay: Admit: 2016-08-05 | Discharge: 2016-08-05 | Payer: Commercial Managed Care - PPO

## 2016-08-05 DIAGNOSIS — Z79899 Other long term (current) drug therapy: ICD-10-CM

## 2016-08-05 DIAGNOSIS — Z9049 Acquired absence of other specified parts of digestive tract: ICD-10-CM

## 2016-08-05 DIAGNOSIS — K921 Melena: Principal | ICD-10-CM

## 2016-08-05 DIAGNOSIS — K5730 Diverticulosis of large intestine without perforation or abscess without bleeding: ICD-10-CM

## 2016-08-05 DIAGNOSIS — R0902 Hypoxemia: ICD-10-CM

## 2016-08-05 DIAGNOSIS — F419 Anxiety disorder, unspecified: Principal | ICD-10-CM

## 2016-08-05 DIAGNOSIS — D649 Anemia, unspecified: ICD-10-CM

## 2016-08-05 DIAGNOSIS — E785 Hyperlipidemia, unspecified: ICD-10-CM

## 2016-08-05 DIAGNOSIS — D509 Iron deficiency anemia, unspecified: ICD-10-CM

## 2016-08-05 DIAGNOSIS — K219 Gastro-esophageal reflux disease without esophagitis: ICD-10-CM

## 2016-08-05 DIAGNOSIS — E669 Obesity, unspecified: ICD-10-CM

## 2016-08-05 DIAGNOSIS — Z90710 Acquired absence of both cervix and uterus: ICD-10-CM

## 2016-08-05 DIAGNOSIS — K626 Ulcer of anus and rectum: ICD-10-CM

## 2016-08-05 DIAGNOSIS — R1032 Left lower quadrant pain: ICD-10-CM

## 2016-08-05 DIAGNOSIS — Z800 Family history of malignant neoplasm of digestive organs: ICD-10-CM

## 2016-08-05 DIAGNOSIS — R400 Somnolence: ICD-10-CM

## 2016-08-05 DIAGNOSIS — Z8719 Personal history of other diseases of the digestive system: ICD-10-CM

## 2016-08-05 DIAGNOSIS — Z90721 Acquired absence of ovaries, unilateral: ICD-10-CM

## 2016-08-05 DIAGNOSIS — Z765 Malingerer [conscious simulation]: ICD-10-CM

## 2016-08-05 DIAGNOSIS — F329 Major depressive disorder, single episode, unspecified: ICD-10-CM

## 2016-08-05 DIAGNOSIS — K760 Fatty (change of) liver, not elsewhere classified: ICD-10-CM

## 2016-08-05 DIAGNOSIS — N823 Fistula of vagina to large intestine: ICD-10-CM

## 2016-08-05 DIAGNOSIS — Z6841 Body Mass Index (BMI) 40.0 and over, adult: ICD-10-CM

## 2016-08-05 DIAGNOSIS — F17210 Nicotine dependence, cigarettes, uncomplicated: ICD-10-CM

## 2016-08-05 DIAGNOSIS — E1165 Type 2 diabetes mellitus with hyperglycemia: ICD-10-CM

## 2016-08-05 DIAGNOSIS — K429 Umbilical hernia without obstruction or gangrene: ICD-10-CM

## 2016-08-05 DIAGNOSIS — Z5320 Procedure and treatment not carried out because of patient's decision for unspecified reasons: ICD-10-CM

## 2016-08-05 LAB — COMPREHENSIVE METABOLIC PANEL: Lab: 139 MMOL/L — ABNORMAL LOW (ref >60)

## 2016-08-05 LAB — CBC AND DIFF: Lab: 4.9 K/UL — ABNORMAL LOW (ref 4.5–11.0)

## 2016-08-05 MED ORDER — FERROUS SULFATE 325 MG (65 MG IRON) PO TAB
325 mg | ORAL | 0 refills | Status: DC
Start: 2016-08-05 — End: 2016-08-05

## 2016-08-05 MED ORDER — LIDOCAINE (PF) 200 MG/10 ML (2 %) IJ SYRG
0 refills | Status: DC
Start: 2016-08-05 — End: 2016-08-05
  Administered 2016-08-05: 18:00:00 100 mg via INTRAVENOUS

## 2016-08-05 MED ORDER — HYDROMORPHONE 2 MG PO TAB
1-2 mg | ORAL | 0 refills | Status: DC | PRN
Start: 2016-08-05 — End: 2016-08-05

## 2016-08-05 MED ORDER — FENTANYL CITRATE (PF) 50 MCG/ML IJ SOLN
50 ug | INTRAVENOUS | 0 refills | Status: CN | PRN
Start: 2016-08-05 — End: ?

## 2016-08-05 MED ORDER — GABAPENTIN 100 MG PO CAP
100 mg | Freq: Three times a day (TID) | ORAL | 0 refills | Status: DC
Start: 2016-08-05 — End: 2016-08-05

## 2016-08-05 MED ORDER — LACTATED RINGERS IV SOLP
0 refills | Status: DC
Start: 2016-08-05 — End: 2016-08-05
  Administered 2016-08-05: 18:00:00 via INTRAVENOUS

## 2016-08-05 MED ORDER — SODIUM CHLORIDE 0.9 % IV SOLP
INTRAVENOUS | 0 refills | Status: DC
Start: 2016-08-05 — End: 2016-08-05

## 2016-08-05 MED ORDER — METFORMIN 500 MG PO TAB
500 mg | Freq: Every day | ORAL | 0 refills | Status: DC
Start: 2016-08-05 — End: 2016-08-05

## 2016-08-05 MED ORDER — PROPOFOL 10 MG/ML IV EMUL 20 ML (INFUSION)(AM)(OR)
INTRAVENOUS | 0 refills | Status: DC
Start: 2016-08-05 — End: 2016-08-05
  Administered 2016-08-05: 18:00:00 80 ug/kg/min via INTRAVENOUS

## 2016-08-05 MED ORDER — POLYETHYLENE GLYCOL 3350 17 GRAM/DOSE PO POWD
17 g | Freq: Every day | ORAL | 0 refills | 22.00000 days | Status: AC | PRN
Start: 2016-08-05 — End: 2019-02-18

## 2016-08-05 MED ORDER — LACTATED RINGERS IV SOLP
Freq: Once | INTRAVENOUS | 0 refills | Status: CP
Start: 2016-08-05 — End: ?
  Administered 2016-08-05: 16:00:00 1000.000 mL via INTRAVENOUS

## 2016-08-05 MED ORDER — FENTANYL CITRATE (PF) 50 MCG/ML IJ SOLN
25 ug | INTRAVENOUS | 0 refills | Status: DC | PRN
Start: 2016-08-05 — End: 2016-08-05

## 2016-08-05 MED ORDER — IMS MIXTURE TEMPLATE
600 mg | ORAL | 0 refills | Status: DC
Start: 2016-08-05 — End: 2016-08-05

## 2016-08-05 MED ORDER — PROPOFOL INJ 10 MG/ML IV VIAL
0 refills | Status: DC
Start: 2016-08-05 — End: 2016-08-05
  Administered 2016-08-05 (×3): 10 mg via INTRAVENOUS

## 2016-08-05 MED ORDER — CYANOCOBALAMIN (VITAMIN B-12) 1,000 MCG/ML IJ SOLN
1000 ug | Freq: Once | INTRAMUSCULAR | 0 refills | Status: DC
Start: 2016-08-05 — End: 2016-08-05

## 2016-08-05 NOTE — Progress Notes
Pt is back from procedure and is upset she cannot have any more Dilaudid. MD notified of need for diet and pt wanting to leave AMA.

## 2016-08-05 NOTE — Progress Notes
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        Colonoscopy today :   A total of three distinct ulcers were seen in the rectum: 3 mm, 2 cm, 7     mm. All had a clean base. Multiple biospies were obtained. Rest of the     colonic mucosa looked completely normal.       Recommend;     - follow up on biopsies results  - consider CT abd /pelvis for eval of the lower abd pain   - follow up in GI clinic   - start cnaza supp 1 sup qhs and steroid supp ( anusol HC ) 1 supp in am

## 2016-08-05 NOTE — Progress Notes
Tanya French left the hospital AMA at approx 1530 (unwitnessed by staff) on 08/05/2016. She pulled out her iv and took off her arm bands and left them on the sink in her room. MD notifed via verison page, text on volte phone and attempted to call. Nurse manager witnessed discarded iv and arm bands in room. Unit coordinator notified also.    .  .  Valuables returned: n/a   .  Home medications: none   .  Functional assessment at discharge complete: No.

## 2016-08-05 NOTE — H&P (View-Only)
Pre Procedure History and Physical/Sedation Plan    Name:Tanya French                                                                   MRN: [id number]                 DOB:12/01/1965          Age: 51 y.o.  Admission Date: 08/04/2016             Days Admitted: LOS: 1 day      Procedure Date:  08/05/2016    Planned Procedure(s):  GI:  Colonoscopy  Sedation/Medication Plan:  MAC (Monitored Anesthesia Care)  Discussion/Reviews:  Physician has discussed risks and alternatives of this type of sedation and above planned procedures with patient  ________________________________________________________________  Chief Complaint:  Inpatient endo consult note reviewed.  Rectal ulcers for the past 4 years; responds to suppositories; pelvic pain; hematochezia; denies history of sexually transmitted diseases    Previous Anesthetic/Sedation History:  reviewed    Allergies:  Toradol [ketorolac]; Keflex [cephalexin]; and Compazine [prochlorperazine]  Medications:  Scheduled Meds:  [MAR Hold] cyanocobalamin (VITAMIN B-12, RUBRAMIN) injection 1,000 mcg 1,000 mcg Intramuscular ONCE   [MAR Hold] dicyclomine (BENTYL) tablet 20 mg 20 mg Oral Q6H   [MAR Hold] duloxetine DR (CYMBALTA) capsule 120 mg 120 mg Oral QHS   [MAR Hold] electrolyte GUT PEG (NULYTELY, COLYTE, GAVILYTE-N) oral solution 4 L 4 L Oral As Prescribed   [MAR Hold] ferrous sulfate (FEOSOL, FEROSUL) tablet 325 mg 325 mg Oral QDAY   gabapentin (NEURONTIN) capsule 100 mg 100 mg Oral TID   [MAR Hold] ibuprofen (ADVIL) tablet 600 mg 600 mg Oral Q6H   [MAR Hold] pantoprazole DR (PROTONIX) tablet 40 mg 40 mg Oral QDAY(21)   [MAR Hold] simvastatin (ZOCOR) tablet 40 mg 40 mg Oral QHS   Continuous Infusions:  PRN and Respiratory Meds:[MAR Hold] acetaminophen Q6H PRN, [MAR Hold] ALPRAZolam BID PRN, [MAR Hold] electrolyte GUT PEG PRN (On Call from Rx), [MAR Hold] melatonin QHS PRN, [MAR Hold] ondansetron Q8H PRN       Vital Signs:  Last Filed Vital Signs: 24 Hour Range BP: 150/108 (06/01 1110)  Temp: 36.9 ???C (98.4 ???F) (06/01 1110)  Pulse: 86 (06/01 1110)  Respirations: 13 PER MINUTE (06/01 1110)  SpO2: 91 % (06/01 1110)  O2 Delivery: None (Room Air) (06/01 1110) BP: (119-150)/(80-108)   Temp:  [36.6 ???C (97.8 ???F)-37.1 ???C (98.8 ???F)]   Pulse:  [81-91]   Respirations:  [13 PER MINUTE-18 PER MINUTE]   SpO2:  [87 %-96 %]   O2 Delivery: None (Room Air)     NPO Status:     Airway:  airway assessment performed  Mallampati II (soft palate, uvula, fauces visible)  Anesthesia Classification:  ASA III (A patient with a severe systemic disease that limits activity, but is not incapacitating)  NPO Status: Acceptable  Preganancy Status: Not Pregnant    Lab/Radiology/Other Diagnostic Tests  Labs:  Labs reviewed    Tommie Sams, MD  Pager

## 2016-08-05 NOTE — Care Coordination-Inpatient
Change of service to Med Private [phone]

## 2016-08-05 NOTE — Case Management (ED)
Case Management Note    Plan:  NCM attempted to meet pt at bedside for assessment but was advised by a staff member that she was off unit for procedure.    Intervention: NCM reviewed EMR for poc and is following for assistance with dc planning.      NCM to continue to follow and assist with dc needs.    Alen BleacherMarilyn Seema Blum, NCM  [phone]

## 2016-08-05 NOTE — Progress Notes
General Progress Note    Name:  Trameka Kooi   ZOXWR'U Date:  08/05/2016  Admission Date: 08/04/2016  LOS: 1 day                     Assessment/Plan:    Active Problems:    Rectal bleeding    Rectal pain    Merleen Milliner Console???is a 51 y.o.???female???with a past medical history significant for recurrent rectal ulcerations, HLD, GERD,???Obesity, and anxiety/depression who presented with a 1 day history of LLQ and rectal pain and small volume bright red blood per rectum  ???  Recurrent rectal ulcerations  LLQ abdominal pain  Rectal bleeding  - Patient has had recurrent rectal ulcerations. No diagnosis of IBD.  - Patient notes 2-3 exacerbations of her rectal ulcers this year.  Most recently in April.   - Patient uses hydrocortisone and mesalamine supp. for treatment of rectal ulcerations previously with good response  - Patient does not follow with outpatient GI doctor  - Most recent colonoscopy reportedly at [facility] hospital in April 2018  - Colonoscopy (03/2013): 2 cm rectal mass with central ulceration. Pathology of rectal mass showed focal adenomatous changes in background of marked acute and chronic inflammation, mucosal ulceration, necrosis and fibrinopurulent exudate. Negative for CMV, HSV-I/II  - Flex sig 03/19/2016 showed 2-3 cm area in distal rectum with multiple shallow ulcers that involved 75% circumference of the rectum with places of spared inter-ulcer mucosa. Rectal biopsy showed ulceration with fibrinopurulent exudate, cryptitis and atrophic glands. Hyperplastic changes. Negative for malignancy.   - CT A/P (06/13/2016): Mild persistent nonspecific soft tissue thickening of the rectum with mild perirectal fat stranding. Findings are nonspecific and may be from inflammation. Rectal mass could potentially produce this appearance.   - Of note: Patient has had multiple admissions for similar complaints, but has history of leaving AMA when not given IV pain medications. - ESR/CRP negative, Hg stable  - Endoscopy 6/1 with 3 rectal ulcers, no other abnormalities on colonoscopy  > Diabetic Diet  > Given significant somnolence and concern for respiratory depression, will hold further opioid therapy at this time.  > Multimodal abdominal pain regimen, bentyl, ibuprofen, ppi, gabapentin for possible visceral hyperalgesia  > Per review or care everywhere 5+ biopsies with nonspecific findings in rectal ulcers.  > Await final GI reccs, anticipate DC 6/2 with anti-inflammatory regimen.  ???  Anemia suspected secondary to iron-deficiency   - Iron studies (06/2016): High TIBC and low iron sat. Low normal ferritin likely secondary to role as acute inflammatory marker  > Change Iron Sulfate to Q2D dosing for increased absorption and decreased side effects  ???  HLD  > Continue PTA simvastatin  ???  Depression/Anxiety  > Continue PTA duloxetine  ???  GERD  > Continue PTA PPI???  ???  Hyperglycemia  - Recent Hgb A1c of 6.5 on 06/13/16  > Start metformin 500mg  Qday, plan to increase to 500 BID for DC.  ???  FEN: Diabetic diet, no ppx  Code: full  ???  Dispo: Anticipate DC 6/2    ________________________________________________________________________    Subjective  Kynslee Betsch is a 51 y.o. female.  Patient somnolent this morning, states that she is in severe abdominal pain.  Arousable but per pt required O2 overnight because of intermittent hypoxia during rest.  Requesting IV dilaudid because that's the only thing that works, but if I ask you won't give it to me.   Discussed onset of symptoms and previous episodes,  pt states that she only take anti-inflammatory suppositories for 2 to 5 days to stop symptoms, then stops and does not continue until another flare.  Discussed possible need to continue therapy to prevent future flares, as well as importance of multi-modality pain therapy.    Medications  Scheduled Meds:  dicyclomine (BENTYL) tablet 20 mg 20 mg Oral Q6H duloxetine DR (CYMBALTA) capsule 120 mg 120 mg Oral QHS   electrolyte GUT PEG (NULYTELY, COLYTE, GAVILYTE-N) oral solution 4 L 4 L Oral As Prescribed   ferrous sulfate (FEOSOL, FEROSUL) tablet 325 mg 325 mg Oral QDAY   pantoprazole DR (PROTONIX) tablet 40 mg 40 mg Oral QDAY(21)   simvastatin (ZOCOR) tablet 40 mg 40 mg Oral QHS   Continuous Infusions:  ??? sodium chloride 0.9 %   infusion 100 mL/hr at 08/05/16 0204     PRN and Respiratory Meds:acetaminophen Q6H PRN, ALPRAZolam BID PRN, electrolyte GUT PEG PRN (On Call from Rx), HYDROmorphone Q4H PRN, melatonin QHS PRN, ondansetron Q8H PRN      Review of Systems:  see subjective    Objective:                          Vital Signs: Last Filed                 Vital Signs: 24 Hour Range   BP: 119/87 (06/01 0356)  Temp: 36.7 ???C (98.1 ???F) (06/01 0356)  Pulse: 81 (06/01 0356)  Respirations: 18 PER MINUTE (06/01 0356)  SpO2: 93 % (06/01 0356)  O2 Delivery: None (Room Air) (06/01 0356)  SpO2 Pulse: 99 (05/31 1204)  Height: 162.6 cm (64) (05/31 0810) BP: (119-148)/(87-108)   Temp:  [36.6 ???C (97.8 ???F)-36.8 ???C (98.2 ???F)]   Pulse:  [81-91]   Respirations:  [18 PER MINUTE]   SpO2:  [93 %-96 %]   O2 Delivery: None (Room Air)   Intensity Pain Scale 0-10 (Pain 1): 8 (08/05/16 1610) Vitals:    08/04/16 0810   Weight: 113.4 kg (250 lb)       Intake/Output Summary:  (Last 24 hours)    Intake/Output Summary (Last 24 hours) at 08/05/16 0805  Last data filed at 08/05/16 0700   Gross per 24 hour   Intake             1527 ml   Output                0 ml   Net             1527 ml      Stool Occurrence: 1    Physical Exam  Gen: Mild Distress, AOx3, vital reviewed  Eyes: EOMI, no icterus  Neck: Supple, No JVP  Chest: CTAB, NTTP  Heart: RRR, No murmurs appreciated  Abd: LLQ mildly TTP, no rebound, no guarding, NBS  Extremities: pulses 2+ Bilat LE, no cyanosis  Neuro: CN II-XII grossly intact, moving all extremities spontaneously        Lab Review  24-hour labs: Results for orders placed or performed during the hospital encounter of 08/04/16 (from the past 24 hour(s))   C REACTIVE PROTEIN (CRP)    Collection Time: 08/04/16  4:13 PM   Result Value Ref Range    C-Reactive Protein 0.82 <1.0 MG/DL   SED RATE    Collection Time: 08/04/16  4:13 PM   Result Value Ref Range    Sed Rate -ESR 15 0 - 20 MM/HR  HEMOGLOBIN & HEMATOCRIT    Collection Time: 08/04/16  4:13 PM   Result Value Ref Range    Hemoglobin 9.2 (L) 12.0 - 15.0 GM/DL    Hematocrit 19.1 (L) 36 - 45 %   HEMOGLOBIN & HEMATOCRIT    Collection Time: 08/04/16 10:50 PM   Result Value Ref Range    Hemoglobin 9.7 (L) 12.0 - 15.0 GM/DL    Hematocrit 47.8 (L) 36 - 45 %   CBC AND DIFF    Collection Time: 08/05/16  8:27 AM   Result Value Ref Range    White Blood Cells 4.9 4.5 - 11.0 K/UL    RBC 3.53 (L) 4.0 - 5.0 M/UL    Hemoglobin 9.3 (L) 12.0 - 15.0 GM/DL    Hematocrit 29.5 (L) 36 - 45 %    MCV 80.1 80 - 100 FL    MCH 26.3 26 - 34 PG    MCHC 32.8 32.0 - 36.0 G/DL    RDW 62.1 (H) 11 - 15 %    Platelet Count 223 150 - 400 K/UL    MPV 6.4 (L) 7 - 11 FL    Neutrophils 87 (H) 41 - 77 %    Lymphocytes 6 (L) 24 - 44 %    Monocytes 5 4 - 12 %    Eosinophils 2 0 - 5 %    Basophils 0 0 - 2 %    Absolute Neutrophil Count 4.20 1.8 - 7.0 K/UL    Absolute Lymph Count 0.30 (L) 1.0 - 4.8 K/UL    Absolute Monocyte Count 0.20 0 - 0.80 K/UL    Absolute Eosinophil Count 0.10 0 - 0.45 K/UL    Absolute Basophil Count 0.00 0 - 0.20 K/UL   COMPREHENSIVE METABOLIC PANEL    Collection Time: 08/05/16  8:27 AM   Result Value Ref Range    Sodium 139 137 - 147 MMOL/L    Potassium 3.9 3.5 - 5.1 MMOL/L    Chloride 107 98 - 110 MMOL/L    Glucose 130 (H) 70 - 100 MG/DL    Blood Urea Nitrogen 6 (L) 7 - 25 MG/DL    Creatinine 3.08 0.4 - 1.00 MG/DL    Calcium 8.7 8.5 - 65.7 MG/DL    Total Protein 6.4 6.0 - 8.0 G/DL    Total Bilirubin 0.2 (L) 0.3 - 1.2 MG/DL    Albumin 3.8 3.5 - 5.0 G/DL    Alk Phosphatase 98 25 - 110 U/L    AST (SGOT) 15 7 - 40 U/L CO2 25 21 - 30 MMOL/L    ALT (SGPT) 16 7 - 56 U/L    Anion Gap 7 3 - 12    eGFR Non African American >60 >60 mL/min    eGFR African American >60 >60 mL/min       Point of Care Testing  (Last 24 hours)  Glucose: (!) 176 (08/04/16 0820)  Hemoccult: (S) Positive (Per Dr. Chestine Spore) (08/04/16 8469)    Radiology and other Diagnostics Review:    Pertinent radiology reviewed.    Karen Chafe, DO   Pager [phone]

## 2016-08-06 NOTE — Anesthesia Post-Procedure Evaluation
Post-Anesthesia Evaluation    Name: Tanya CrosbyCarla Michelle Clare      MRN: [id number]     DOB: 07/09/1965     Age: 51 y.o.     Sex: female   __________________________________________________________________________     Procedure Date: 08/05/2016  Procedure: Procedure(s):  COLONOSCOPY  COLONOSCOPY BIOPSY      Surgeon: Surgeon(s):  Tommie SamsBansal, Ajay, MD    Post-Anesthesia Vitals          Post Anesthesia Evaluation Note    Evaluation location: Pre/Post  Patient participation: recovered; patient participated in evaluation  Level of consciousness: alert  Pain management: adequate (Improved with fentanyl)    Hydration: normovolemia  Airway patency: adequate    Perioperative Events  Perioperative events:  no       Post-op nausea and vomiting: no PONV    Postoperative Status  Cardiovascular status: hemodynamically stable  Respiratory status: spontaneous ventilation  Follow-up needed: none      Perioperative Events  Perioperative Event: No  Emergency Case Activation: No

## 2016-08-06 NOTE — Discharge Instructions - Pharmacy
PHYSICIAN DISCHARGE SUMMARY  [facility] - DEPARTMENT OF INTERNAL MEDICINE    Patient Name Tanya French  Age 51 years   Date Of Birth [birth year]  Medical Record Number [id number]  Account Number [id number]    Admission Date 08/04/2016 - Discharge Date 08/05/2016  Length of Stay 1 days    Discharging Physician Karen Chafe, DO - Service Med Private [id number]- [id number]  Primary Care Physician Jamie Kato [place]  _____________________________________________________________________________    Medication Changes (w/rationale Clarnce Flock below])  Metformin 500 twice daily for new diagnosis of diabetes mellitus, patient left before medication could be prescribed.    Data Pending at Time of Discharge  Colonoscopy biopsy pathology results    Plan for Further Evaluation and Management  None at this time as patient left AMA before arrangements could be made   _____________________________________________________________________________    Reason for Hospitalization/Presenting Complaint  Patient is a 51 year old female who presented to [facility] for severe abdominal pain for which she has been hospitalized multiple times this year and undergone colonoscopies at [place], [facility], and at least one additional outside hospital.  Last exacerbation was previously in April.  Patient was treated with opioid and non-opioid medications for severe abdominal pain, given concern for rectal bleeding her hemoglobin was trended and remained stable.  She underwent a colonoscopy which demonstrated multiple rectal ulcers from which biopsies were taken but no other abnormalities in the colon.  Of note ESR and CRP were both negative, pointing against inflammatory bowel disease.      Primary Discharge Diagnoses  Rectal ulcers    Secondary Diagnoses/Significant PMH  Past Medical History:   Diagnosis Date   ??? Anemia    ??? Anxiety    ??? GERD (gastroesophageal reflux disease)    ??? Hepatic steatosis    ??? Hyperlipidemia ??? Periumbilical hernia    ??? Rectal ulcer    ??? 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 Sigmoid diverticulosis      Hospital Course  Patient is a 51 year old female who presented to [facility] for severe abdominal pain for which she has been hospitalized multiple times this year and undergone colonoscopies at [place], [facility], and at least one additional outside hospital.  Last exacerbation was previously in April.  Patient was treated with opioid and non-opioid medications for severe abdominal pain, given concern for rectal bleeding her hemoglobin was trended and remained stable.  She underwent a colonoscopy which demonstrated multiple rectal ulcers from which biopsies were taken but no other abnormalities in the colon.  Of note ESR and CRP were both negative, pointing against inflammatory bowel disease.    Patient was also found to have multiple episodes of hyperglycemia during hospitalization including fasting blood sugar of 130 on a.m. labs.  This in combination with a recent hemoglobin A1c of 6.5 led to a diagnosis of diabetes mellitus for which she was planned to start metformin.      Patient repeatedly requested IV Dilaudid during the admission despite having equi-analgesic doses of oral medications.  Discussed with her how with likely neuropathic abdominal pain opioid medications were not indicated and switched her to a multimodal approach of non-opioid medications.  After returning from colonoscopy patient removed her IV and left without telling anyone.      Initial evaluation was directed at rectal/colon examination.    At the time of discharge, the highest clinical suspicion was that the presenting complaint was caused by  Rectal Ulcers of unclear etiology..  _____________________________________________________________________________    Significant Procedures Performed & Date  Colonoscopy: Significant only for 3 rectal ulcers    Significant Imaging Studies  N/a Significant Labs  ESR/CRP both negative    Significant Microbiology  None    Significant Pathology  Rectal biopsies pending    Labs In The Final 24h of Hospitalization  No results found for this visit on 08/04/16 (from the past 24 hour(s)).  Upcoming Appointments within [facility]    No future appointments.  _____________________________________________________________________________    Condition at Discharge  Stable  Patient Disposition  Home  Patient Instructions  Advised to keep all follow-up appointments.  Consults  GI  _____________________________________________________________________________    Patient Instructions/Discharge Medication List     Activity as Tolerated   It is important to keep increasing your activity level after you leave the hospital.  Moving around can help prevent blood clots, lung infection (pneumonia) and other problems.  Gradually increasing the number of times you are up moving around will help you return to your normal activity level more quickly.  Continue to increase the number of times you are up to the chair and walking daily to return to your normal activity level. Begin to work toward your normal activity level at discharge     Report These Signs and Symptoms   Please contact your doctor if you have any of the following symptoms: persistent nausea and/or vomiting, severe abdominal pain or unable to have bowel movement     Questions About Your Stay   For questions or concerns regarding your hospital stay. Call [phone]   Discharging attending physician: Mickey Farber [[id number]]      Regular Diet   You have no dietary restriction. Please continue with a healthy balanced diet.        Current Discharge Medication List       CONTINUE these medications which have been CHANGED or REFILLED    Details   polyethylene glycol 3350 (GLYCOLAX; MIRALAX) 17 gram/dose powder Take 17 g by mouth daily as needed.    PRESCRIPTION TYPE:  No Print CONTINUE these medications which have NOT CHANGED    Details   ALPRAZolam XR(+) (XANAX XR) 2 mg tablet Take 2 mg by mouth every morning.    PRESCRIPTION TYPE:  Historical Med      dicyclomine (BENTYL) 20 mg tablet Take 1 Tab by mouth every 6 hours.  Qty: 20 Tab, Refills: 0    PRESCRIPTION TYPE:  Normal      duloxetine DR (CYMBALTA) 60 mg capsule Take 120 mg by mouth at bedtime daily.    PRESCRIPTION TYPE:  Historical Med      ferrous sulfate (FEOSOL, FEROSUL) 325 mg (65 mg iron) tablet Take 1 Tab by mouth daily.  Qty: 30 Tab, Refills: 3    PRESCRIPTION TYPE:  Normal      lansoprazole DR (PREVACID) 30 mg PO capsule Take 30 mg by mouth twice daily.    PRESCRIPTION TYPE:  Historical Med      ondansetron (ZOFRAN ODT) 4 mg rapid dissolve tablet Dissolve 1 Tab by mouth every 8 hours as needed for Nausea or Vomiting. Place on tongue to disolve.  Qty: 30 Tab, Refills: 0    PRESCRIPTION TYPE:  Normal      simvastatin (ZOCOR) 40 mg PO tablet Take 40 mg by mouth at bedtime daily.    PRESCRIPTION TYPE:  Historical Med  The following medications were removed from your list. This list includes medications discontinued this stay and those removed from your prior med list in our system        ranitidine hcl(+) (ZANTAC) 150 mg tablet            _____________________________________________________________________________    Shelda Altes, M.D.  Department of Internal Medicine  [facility]  [address]  [place], [place]  [address]  [phone] (Office Phone)  [phone] (Office Fax)    Cc: Primary Care Provider  Jamie Kato [place]  [address] [place] / [place] [address]  Fax: [phone]  Phone: [phone]

## 2016-08-08 ENCOUNTER — Encounter: Admit: 2016-08-08 | Discharge: 2016-08-08 | Payer: Commercial Managed Care - PPO

## 2016-08-08 DIAGNOSIS — D649 Anemia, unspecified: ICD-10-CM

## 2016-08-08 DIAGNOSIS — K219 Gastro-esophageal reflux disease without esophagitis: ICD-10-CM

## 2016-08-08 DIAGNOSIS — K760 Fatty (change of) liver, not elsewhere classified: ICD-10-CM

## 2016-08-08 DIAGNOSIS — E785 Hyperlipidemia, unspecified: ICD-10-CM

## 2016-08-08 DIAGNOSIS — N823 Fistula of vagina to large intestine: ICD-10-CM

## 2016-08-08 DIAGNOSIS — K5730 Diverticulosis of large intestine without perforation or abscess without bleeding: ICD-10-CM

## 2016-08-08 DIAGNOSIS — F419 Anxiety disorder, unspecified: Principal | ICD-10-CM

## 2016-08-08 DIAGNOSIS — K626 Ulcer of anus and rectum: ICD-10-CM

## 2016-08-08 DIAGNOSIS — K429 Umbilical hernia without obstruction or gangrene: ICD-10-CM

## 2016-08-22 ENCOUNTER — Encounter: Admit: 2016-08-22 | Discharge: 2016-08-22 | Payer: Commercial Managed Care - PPO

## 2016-08-31 ENCOUNTER — Emergency Department
Admit: 2016-08-31 | Payer: PRIVATE HEALTH INSURANCE | Primary: Student in an Organized Health Care Education/Training Program

## 2016-08-31 ENCOUNTER — Inpatient Hospital Stay
Admit: 2016-08-31 | Discharge: 2016-08-31 | Disposition: A | Discharge status: Home / Self Care | Payer: PRIVATE HEALTH INSURANCE | Attending: Emergency Medicine

## 2016-08-31 DIAGNOSIS — R1032 Left lower quadrant pain: Secondary | ICD-10-CM

## 2016-08-31 LAB — CBC WITH AUTOMATED DIFF
ABS. BASOPHILS: 0 10*3/uL (ref 0.0–0.06)
ABS. EOSINOPHILS: 0.1 10*3/uL (ref 0.0–0.4)
ABS. LYMPHOCYTES: 0.8 10*3/uL — ABNORMAL LOW (ref 0.9–3.6)
ABS. MONOCYTES: 0.4 10*3/uL (ref 0.05–1.2)
ABS. NEUTROPHILS: 4.9 10*3/uL (ref 1.8–8.0)
BASOPHILS: 0 % (ref 0–2)
EOSINOPHILS: 2 % (ref 0–5)
HCT: 30.5 % — ABNORMAL LOW (ref 35.0–45.0)
HGB: 9.6 g/dL — ABNORMAL LOW (ref 12.0–16.0)
LYMPHOCYTES: 12 % — ABNORMAL LOW (ref 21–52)
MCH: 25.7 PG (ref 24.0–34.0)
MCHC: 31.5 g/dL (ref 31.0–37.0)
MCV: 81.8 FL (ref 74.0–97.0)
MONOCYTES: 7 % (ref 3–10)
MPV: 8.9 FL — ABNORMAL LOW (ref 9.2–11.8)
NEUTROPHILS: 79 % — ABNORMAL HIGH (ref 40–73)
PLATELET: 238 10*3/uL (ref 135–420)
RBC: 3.73 M/uL — ABNORMAL LOW (ref 4.20–5.30)
RDW: 14.6 % — ABNORMAL HIGH (ref 11.6–14.5)
WBC: 6.2 10*3/uL (ref 4.6–13.2)

## 2016-08-31 LAB — URINALYSIS W/ RFLX MICROSCOPIC
Bilirubin: NEGATIVE
Blood: NEGATIVE
Glucose: NEGATIVE mg/dL
Ketone: NEGATIVE mg/dL
Leukocyte Esterase: NEGATIVE
Nitrites: NEGATIVE
Protein: NEGATIVE mg/dL
Specific gravity: 1.009 (ref 1.005–1.030)
Urobilinogen: 0.2 EU/dL (ref 0.2–1.0)
pH (UA): 6.5 (ref 5.0–8.0)

## 2016-08-31 LAB — METABOLIC PANEL, BASIC
Anion gap: 5 mmol/L (ref 3.0–18)
BUN/Creatinine ratio: 13 (ref 12–20)
BUN: 9 MG/DL (ref 7.0–18)
CO2: 30 mmol/L (ref 21–32)
Calcium: 8.4 MG/DL — ABNORMAL LOW (ref 8.5–10.1)
Chloride: 107 mmol/L (ref 100–108)
Creatinine: 0.68 MG/DL (ref 0.6–1.3)
GFR est AA: >60 mL/min/{1.73_m2} (ref >60)
GFR est non-AA: >60 mL/min/{1.73_m2} (ref >60)
Glucose: 127 mg/dL — ABNORMAL HIGH (ref 74–99)
Potassium: 4 mmol/L (ref 3.5–5.5)
Sodium: 142 mmol/L (ref 136–145)

## 2016-08-31 LAB — HEPATIC FUNCTION PANEL
A-G Ratio: 1.1 (ref 0.8–1.7)
ALT (SGPT): 25 U/L (ref 13–56)
AST (SGOT): 13 U/L — ABNORMAL LOW (ref 15–37)
Albumin: 3.6 g/dL (ref 3.4–5.0)
Alk. phosphatase: 137 U/L — ABNORMAL HIGH (ref 45–117)
Bilirubin, direct: <0.1 MG/DL (ref 0.0–0.2)
Bilirubin, total: 0.2 MG/DL (ref 0.2–1.0)
Globulin: 3.4 g/dL (ref 2.0–4.0)
Protein, total: 7 g/dL (ref 6.4–8.2)

## 2016-08-31 LAB — TYPE & SCREEN
ABO/Rh(D): O POS
Antibody screen: NEGATIVE

## 2016-08-31 LAB — TYPE AND SCREEN
ABO/Rh: O POS
Antibody Screen: NEGATIVE

## 2016-08-31 MED ORDER — MORPHINE 10 MG/ML INJ SOLUTION
10 mg/ml | Freq: Once | INTRAMUSCULAR | Status: AC
Start: 2016-08-31 — End: 2016-08-31
  Administered 2016-08-31: 17:00:00 via INTRAVENOUS

## 2016-08-31 MED ORDER — IOPAMIDOL 61 % IV SOLN
300 mg iodine /mL (61 %) | Freq: Once | INTRAVENOUS | Status: AC
Start: 2016-08-31 — End: 2016-08-31
  Administered 2016-08-31: 17:00:00 via INTRAVENOUS

## 2016-08-31 MED ORDER — MORPHINE 10 MG/ML INJ SOLUTION
10 mg/ml | INTRAMUSCULAR | Status: AC
Start: 2016-08-31 — End: 2016-08-31
  Administered 2016-08-31: 18:00:00 via INTRAVENOUS

## 2016-08-31 MED ORDER — SODIUM CHLORIDE 0.9 % IV
INTRAVENOUS | Status: DC
Start: 2016-08-31 — End: 2016-08-31
  Administered 2016-08-31: 18:00:00 via INTRAVENOUS

## 2016-08-31 MED ORDER — MESALAMINE 1,000 MG RECTAL SUPPOSITORY
1000 mg | Freq: Every evening | RECTAL | 0 refills | Status: AC
Start: 2016-08-31 — End: 2016-09-07

## 2016-08-31 MED ORDER — SODIUM CHLORIDE 0.9% BOLUS IV
0.9 % | Freq: Once | INTRAVENOUS | Status: AC
Start: 2016-08-31 — End: 2016-08-31
  Administered 2016-08-31: 17:00:00 via INTRAVENOUS

## 2016-08-31 MED ORDER — ONDANSETRON (PF) 4 MG/2 ML INJECTION
4 mg/2 mL | INTRAMUSCULAR | Status: AC
Start: 2016-08-31 — End: 2016-08-31
  Administered 2016-08-31: 17:00:00 via INTRAVENOUS

## 2016-08-31 MED FILL — SODIUM CHLORIDE 0.9 % IV: INTRAVENOUS | Qty: 1000

## 2016-08-31 MED FILL — MORPHINE 10 MG/ML IJ SOLN: 10 mg/mL | INTRAMUSCULAR | Qty: 1

## 2016-08-31 MED FILL — ONDANSETRON (PF) 4 MG/2 ML INJECTION: 4 mg/2 mL | INTRAMUSCULAR | Qty: 2

## 2016-08-31 MED FILL — ISOVUE-300  61 % INTRAVENOUS SOLUTION: 300 mg iodine /mL (61 %) | INTRAVENOUS | Qty: 100

## 2016-08-31 NOTE — ED Triage Notes (Signed)
"  I'm having episodes of blood diarrhea, rectal pain, nausea, and abd pain."

## 2016-08-31 NOTE — ED Provider Notes (Signed)
EMERGENCY DEPARTMENT HISTORY AND PHYSICAL EXAM    12:13 PM      Date: 08/31/2016  Patient Name: Katie Dixon    History of Presenting Illness     Chief Complaint   Patient presents with   ??? Abdominal Pain   ??? Rectal Bleeding   ??? Nausea         History Provided By: Patient    Chief Complaint: Blood in stool  Duration:  "Last Night"  Timing:  Constant  Location: Stool  Quality: Bright Red  Severity: Moderate  Modifying Factors: No modifying factors  Associated Symptoms: diarrhea, lower abd pain, sharp rectal pain, nausea      Additional History (Context): 12:15 PM Katie Dixon is a 51 y.o. female with h/o Colitis, acid reflux, and HLD who presents to ED complaining of moderate constant bright red blood in stool associated with diarrhea, lower abd pain, sharp rectal pain, and nausea onset "last night". Patient states that she has had 5 episodes of the "bloody diarrhea". She has had one episode of the same in the past with her colitis. States that her lower abd pain is greater on the left. She states that she is in town from [place] for her daughters dance competition. Denies any Fever, CP, SOB, Vomiting, or difficulty urinating. She admits to [facility] of appendectomy, hysterectomy, cholecystectomy, umbilical hernia repair, surgery for Rectal prolapse. She does not follow with any GI doctor since she has not been having any flares of colitis. Admits to social ETOH use. Denies smoking in months and any drug use. She was brought to the ED by her friend. Denies any sick exposure or eating anything that could irritate her stomach. Also denies taking any abx recently. No other concerns or symptoms at this time.    PCP: Not On File [facility]    Current Facility-Administered Medications   Medication Dose Route Frequency Provider Last Rate Last Dose   ??? 0.9% sodium chloride infusion  125 mL/hr IntraVENous CONTINUOUS Laurence Spates, NP 125 mL/hr at 08/31/16 1412 125 mL/hr at 08/31/16 1412     Current Outpatient Prescriptions    Medication Sig Dispense Refill   ??? mesalamine (CANASA) 1,000 mg suppository Insert 1 Suppository into rectum nightly for 7 days. 7 Suppository 0   ??? ALPRAZolam (XANAX XR) 2 mg XR tablet Take  by mouth every morning.     ??? lansoprazole (PREVACID) 30 mg capsule Take  by mouth Daily (before breakfast).     ??? simvastatin (ZOCOR) 40 mg tablet Take  by mouth nightly.     ??? DULoxetine (CYMBALTA) 60 mg capsule Take 60 mg by mouth daily.         Past History     Past Medical History:  Past Medical History:   Diagnosis Date   ??? Acid reflux    ??? Anemia    ??? Anxiety    ??? Colitis    ??? Depression    ??? High cholesterol        Past Surgical History:  Past Surgical History:   Procedure Laterality Date   ??? HX APPENDECTOMY     ??? HX CHOLECYSTECTOMY     ??? HX HYSTERECTOMY         Family History:  History reviewed. No pertinent family history.    Social History:  Social History   Substance Use Topics   ??? Smoking status: Former Smoker   ??? Smokeless tobacco: Never Used   ??? Alcohol use No  Allergies:  Allergies   Allergen Reactions   ??? Keflex [Cephalexin] Rash   ??? Toradol [Ketorolac] Hives         Review of Systems       Review of Systems   Constitutional: Negative for chills and fever.   HENT: Negative for congestion and sore throat.    Respiratory: Negative for cough and shortness of breath.    Cardiovascular: Negative for chest pain.   Gastrointestinal: Positive for abdominal pain, blood in stool, diarrhea, nausea and rectal pain. Negative for vomiting.   Genitourinary: Negative for dysuria.   Musculoskeletal: Negative for back pain.   Skin: Negative for rash.   Neurological: Negative for dizziness and headaches.   All other systems reviewed and are negative.        Physical Exam     Visit Vitals   ??? BP 134/88 (BP 1 Location: Right arm, BP Patient Position: Sitting)   ??? Pulse (!) 107   ??? Temp 98.8 ??F (37.1 ??C)   ??? Resp 16   ??? Ht 5' 4"  (1.626 m)   ??? Wt 113.4 kg (250 lb)   ??? SpO2 97%   ??? BMI 42.91 kg/m2         Physical Exam   General Exam: Patient is a well developed and well nourished in no distress.  Patient does not appear acutely ill or toxic.  Eye Exam: Lids and conjunctiva are normal  ENT Exam: The general head and facial exam is normal.  The neck is supple without meningeal signs.  No significant adenopathy.  Pulmonary Exam: No respiratory distress.  The respiratory rate is normal.  No stridor.  The breath sounds are equal bilaterally.  There are no wheezes, rales, or rhonchi noted.  Cardiac Exam: The cardiac rate and rhythm are normal.  No significant murmurs, rubs, or gallops.  The peripheral pulses are normal.  Abdominal Exam: Abdomen is soft and non-distended.  No pulsatile masses.  There is LLQ tenderness.  There is no rebound or guarding noted.  Skin and Soft Tissue: The skin is warm and dry, without significant abnormality.  Good color.  Musculoskeletal Exam: No peripheral edema.  The musculoskeletal exam of the lower extremities is normal without significant local tenderness.  Psychiatric: Normal adult with appropriate demeanor and interpersonal interaction.  Is oriented to person, place, and time.  GU: Rectal with no palpable masses or hemorrhoids. Normal colored stool present. Hemoccult positive.       Diagnostic Study Results     Labs -  Recent Results (from the past 12 hour(s))   CBC WITH AUTOMATED DIFF    Collection Time: 08/31/16 12:15 PM   Result Value Ref Range    WBC 6.2 4.6 - 13.2 K/uL    RBC 3.73 (L) 4.20 - 5.30 M/uL    HGB 9.6 (L) 12.0 - 16.0 g/dL    HCT 30.5 (L) 35.0 - 45.0 %    MCV 81.8 74.0 - 97.0 FL    MCH 25.7 24.0 - 34.0 PG    MCHC 31.5 31.0 - 37.0 g/dL    RDW 14.6 (H) 11.6 - 14.5 %    PLATELET 238 135 - 420 K/uL    MPV 8.9 (L) 9.2 - 11.8 FL    NEUTROPHILS 79 (H) 40 - 73 %    LYMPHOCYTES 12 (L) 21 - 52 %    MONOCYTES 7 3 - 10 %    EOSINOPHILS 2 0 - 5 %    BASOPHILS 0 0 -  2 %    ABS. NEUTROPHILS 4.9 1.8 - 8.0 K/UL    ABS. LYMPHOCYTES 0.8 (L) 0.9 - 3.6 K/UL    ABS. MONOCYTES 0.4 0.05 - 1.2 K/UL     ABS. EOSINOPHILS 0.1 0.0 - 0.4 K/UL    ABS. BASOPHILS 0.0 0.0 - 0.06 K/UL    DF AUTOMATED     METABOLIC PANEL, BASIC    Collection Time: 08/31/16 12:15 PM   Result Value Ref Range    Sodium 142 136 - 145 mmol/L    Potassium 4.0 3.5 - 5.5 mmol/L    Chloride 107 100 - 108 mmol/L    CO2 30 21 - 32 mmol/L    Anion gap 5 3.0 - 18 mmol/L    Glucose 127 (H) 74 - 99 mg/dL    BUN 9 7.0 - 18 MG/DL    Creatinine 0.68 0.6 - 1.3 MG/DL    BUN/Creatinine ratio 13 12 - 20      GFR est AA >60 >60 ml/min/1.42m    GFR est non-AA >60 >60 ml/min/1.76m   Calcium 8.4 (L) 8.5 - 10.1 MG/DL   HEPATIC FUNCTION PANEL    Collection Time: 08/31/16 12:15 PM   Result Value Ref Range    Protein, total 7.0 6.4 - 8.2 g/dL    Albumin 3.6 3.4 - 5.0 g/dL    Globulin 3.4 2.0 - 4.0 g/dL    A-G Ratio 1.1 0.8 - 1.7      Bilirubin, total 0.2 0.2 - 1.0 MG/DL    Bilirubin, direct <0.1 0.0 - 0.2 MG/DL    Alk. phosphatase 137 (H) 45 - 117 U/L    AST (SGOT) 13 (L) 15 - 37 U/L    ALT (SGPT) 25 13 - 56 U/L   TYPE & SCREEN    Collection Time: 08/31/16 12:15 PM   Result Value Ref Range    Crossmatch Expiration 09/03/2016     ABO/Rh(D) PENDING     Antibody screen PENDING    URINALYSIS W/ RFLX MICROSCOPIC    Collection Time: 08/31/16 12:30 PM   Result Value Ref Range    Color YELLOW      Appearance CLEAR      Specific gravity 1.009 1.005 - 1.030      pH (UA) 6.5 5.0 - 8.0      Protein NEGATIVE  NEG mg/dL    Glucose NEGATIVE  NEG mg/dL    Ketone NEGATIVE  NEG mg/dL    Bilirubin NEGATIVE  NEG      Blood NEGATIVE  NEG      Urobilinogen 0.2 0.2 - 1.0 EU/dL    Nitrites NEGATIVE  NEG      Leukocyte Esterase NEGATIVE  NEG         Radiologic Studies -   CT ABD PELV W CONT   Final Result   IMPRESSION  IMPRESSION:  ??  ??  1. No CT evidence of acute abdominal abnormality.  ??  2. Severe hepatic steatosis. Cholecystectomy. Hysterectomy. Appendectomy.        Medical Decision Making   I am the first provider for this patient.     I reviewed the vital signs, available nursing notes, past medical history, past surgical history, family history and social history.    Vital Signs-Reviewed the patient's vital signs.    Pulse Oximetry Analysis -  97% on room air, Normal    Cardiac Monitor:  Rate: 107 bpm  Rhythm:  Sinus Tachycardia     Records  Reviewed: Nursing Notes and Old Medical Records (Time of Review: 12:13 PM)    ED Course: Progress Notes, Reevaluation, and Consults:  2:42 PM Patient states that she has a hx of rectal ulcers and that the last time she had similar sx. She was given mesalamine suppository which helped in addition to pain medication. Pt requesting IV dilaudid. 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 Pt then asking if morphine dose was dilaudid equivalent.     2:47 PM After reviewing labs and CT and re-evaluating patient, I reviewed notes through care everywhere from hospitals in pt's home state. Pt has multiple evaluations for same complaints as today. Patient was seen in the ED on the 18th for the same complaints and apparently has these sx every month for the last year.Chart states that she requests narcotics and gets upset when she does not get them and even leaves AMA. There is an FYI on her chart that states she cannot get narcotics for her chronic abd pain. She eloped prior to completion of their last evaluation after not getting IV dilaudid. When I asked pt about her last episode, she states in March. I then explained to her I was able to view her records from other hospitals which indicate frequent ED visits including several recently for this. Pt is not forthcoming about her multiple ED visits for this issue which leads me to have concerns for drug seeking behavior especially with reports of pt leaving AMA from hospitals when not receiving pain meds she wants. I explained to her that I would no be prescribing a narcotic for her symptoms but that she would get mesalamine suppositories and would  need follow up with GI. She has been ambulating in ED without issue. Denies further episodes of rectal bleeding while here. Abdomen is without peritoneal signs. Labs and CT unremarkable and I feel pt is appropriate for discharge with what appears to be a chronic condition.      Diagnosis     Clinical Impression:     ICD-10-CM ICD-9-CM   1. Abdominal pain, LLQ (left lower quadrant) R10.32 789.04   2. Rectal bleeding K62.5 569.3   3. Other chronic pain G89.29 338.29   4. Drug-seeking behavior Z76.5 305.90         Disposition: Discharge    Follow-up Information     Follow up With Details Comments Contact Info    [facility] EMERGENCY DEPT  If symptoms worsen [place]  [phone]       Please follow up with your GI doctor and primary care doctor at home.            Patient's Medications   Start Taking    MESALAMINE (CANASA) 1,000 MG SUPPOSITORY    Insert 1 Suppository into rectum nightly for 7 days.   Continue Taking    ALPRAZOLAM (XANAX XR) 2 MG XR TABLET    Take  by mouth every morning.    DULOXETINE (CYMBALTA) 60 MG CAPSULE    Take 60 mg by mouth daily.    LANSOPRAZOLE (PREVACID) 30 MG CAPSULE    Take  by mouth Daily (before breakfast).    SIMVASTATIN (ZOCOR) 40 MG TABLET    Take  by mouth nightly.   These Medications have changed    No medications on file   Stop Taking    No medications on file     _______________________________    Attestations:  [place] acting as  a scribe for and in the presence of Coston Mandato Randolm Idol, MD      August 31, 2016 at 12:13 PM       Provider Attestation:      I personally performed the services described in the documentation, reviewed the documentation, as recorded by the scribe in my presence, and it accurately and completely records my words and actions. August 31, 2016 at 12:13 PM - Bearl Mulberry, MD    _______________________________

## 2016-08-31 NOTE — ED Notes (Signed)
.  Patient armband removed and shredded

## 2016-09-01 DIAGNOSIS — R109 Unspecified abdominal pain: Secondary | ICD-10-CM

## 2016-09-01 NOTE — ED Triage Notes (Signed)
Patient here for abd pain, nausea and diarrhea, was here for the same yesterday, states symptoms have not resolved.

## 2016-09-01 NOTE — ED Triage Notes (Signed)
Pain in left lower abdomen x 4 hours , loose stools and nausea. History of diverticulitis.

## 2016-09-01 NOTE — ED Notes (Signed)
Pt was offered Bentyl shot and Norco, pt states it will not help with her pain, pt asking for "pain shot" then patient opted to leave

## 2016-09-01 NOTE — ED Provider Notes (Signed)
[facility]  Emergency Department Treatment Report    Patient: Katie Dixon Age: 51 y.o. Sex: female    Date of Birth: 25-Sep-1965 Admit Date: 09/01/2016 PCP: PROVIDER UNKNOWN   MRN: [id number]  CSN: [id number]  Attending: Christiana Pellant, MD   Room: ER28/ER28 Time Dictated: 8:52 PM APP:  Dan Humphreys, PA-C     Chief Complaint   Abdominal pain; Diarrhea; Nausea   History of Present Illness   51 y.o. female complaining of intermittent sharp left lower abdominal pain x 3-4 days. She complains of nausea and several episodes of "loose stools". Denies fever/chills, vomiting or watery diarrhea. Denies melena or rectal bleeding. Denies urinary symptoms. Patient was seen yesterday at [facility] ED for the same complaint.     Review of Systems     Constitutional: No fever, chills  Eyes: No visual symptoms.  ENT: No sore throat, runny nose  Respiratory: No cough, dyspnea or wheezing.  Cardiovascular: No chest pain, palpitations,   Gastrointestinal: positive LLQ abdominal pain, nausea; denies vomiting or diarrhea  Genitourinary: No dysuria or  frequency  Musculoskeletal: No lower extremity swelling  Integumentary: No rashes.  Neurological: No headaches  Denies complaints in all other systems.    Past Medical/Surgical History     Past Medical History:   Diagnosis Date   ??? Acid reflux    ??? Anemia    ??? Anxiety    ??? Colitis    ??? Depression    ??? High cholesterol      Past Surgical History:   Procedure Laterality Date   ??? HX APPENDECTOMY     ??? HX CHOLECYSTECTOMY     ??? HX HYSTERECTOMY         Social History     Social History     Social History   ??? Marital status: DIVORCED     Spouse name: N/A   ??? Number of children: N/A   ??? Years of education: N/A     Social History Main Topics   ??? Smoking status: Former Smoker   ??? Smokeless tobacco: Never Used   ??? Alcohol use No   ??? Drug use: Not on file   ??? Sexual activity: Not on file     Other Topics Concern   ??? Not on file     Social History Narrative    ??? No narrative on file       Family History   No family history on file.    Home Medications     Prior to Admission Medications   Prescriptions Last Dose Informant Patient Reported? Taking?   ALPRAZolam (XANAX XR) 2 mg XR tablet   Yes No   Sig: Take  by mouth every morning.   DULoxetine (CYMBALTA) 60 mg capsule   Yes No   Sig: Take 60 mg by mouth daily.   lansoprazole (PREVACID) 30 mg capsule   Yes No   Sig: Take  by mouth Daily (before breakfast).   mesalamine (CANASA) 1,000 mg suppository   No No   Sig: Insert 1 Suppository into rectum nightly for 7 days.   simvastatin (ZOCOR) 40 mg tablet   Yes No   Sig: Take  by mouth nightly.      Facility-Administered Medications: None       Allergies     Allergies   Allergen Reactions   ??? Keflex [Cephalexin] Rash   ??? Toradol [Ketorolac] Hives       Physical Exam  ED Triage Vitals   ED Encounter Vitals Group      BP 09/01/16 2022 143/104      Pulse (Heart Rate) 09/01/16 2022 106      Resp Rate 09/01/16 2022 18      Temp 09/01/16 2022 99.5 ??F (37.5 ??C)      Temp src --       O2 Sat (%) 09/01/16 2022 100 %      Weight 09/01/16 2012 250 lb      Height 09/01/16 2012 5\' 4"        Constitutional: Patient appears well developed and well nourished. Appearance and behavior are age and situation appropriate.  Respiratory: lungs clear to auscultation, nonlabored respirations. No tachypnea or accessory muscle use.  Cardiovascular: heart regular rate and rhythm without murmur rubs or gallops.    Gastrointestinal:  Abdomen soft, nondistended, nontender without complaint of pain to palpation, bowel sounds present, no CVA tenderness noted bilaterally  Musculoskeletal: Calves soft and non-tender. Distal pulses 2+ and equal bilaterally. No peripheral edema or significant variscosities.   Integumentary: warm and dry without rashes or lesions  Neurologic: alert and oriented. No facial asymmetry or dysarthria. Moving all extremities well    Impression and Management Plan    51 y.o. female complaining of left lower quadrant abdominal pain with associated nausea and loose stools for the past 3-4 days. Patient is afebrile and her vital signs are stable. Patient overall well-appearing, nontoxic. Her abdomen is soft and completely benign on my exam. Patient states these symptoms are the same symptoms she has had intermittently for the last several months. She lives in [place] and here visiting family. She states her PCP back home has been trying different medications to help manage her pain and recently prescribed gabapentin and bentyl which she feels is not helping.  I have reviewed her medical records from past ED visits. She has had multiple ED visits for the same complaint of abdominal pain and other GI symptoms. She has a history of drug seeking behavior. She was just seen yesterday at [facility] ED where she had lab work and a CT of her abdomen and pelvis with contrast which were unremarkable. Again her abdomen is soft and non tender on my exam and she is overall well appearing; I do not feel that we need to repeat diagnostic testing at this time. Patient is understanding but requested a shot of narcotic pain medication. I have discussed with the patient that I do not feel comfortable treating her chronic pain with narcotics and I have offered her an IM injection of Bentyl which she declined. Patient states she just wanted to leave if we were not going to give her any pain medication. Patient encouraged to follow up with her PCP to discuss further management of her chronic pain. Patient advised to return to the ED for new or worsening symptoms.     Final Diagnosis       ICD-10-CM ICD-9-CM   1. Chronic abdominal pain R10.9 789.00    G89.29 338.29   2. Diarrhea, unspecified type R19.7 787.91         Disposition   Patient stable for discharge home  Current Discharge Medication List        The patient was fully discussed with BEN Regenia SkeeterA FICKENSCHER, MD who agrees with  the above assessment and plan      Luna Fuseshley Sophee Mckimmy PA-C  September 01, 2016    My signature above authenticates this document and  my orders, the final ??  diagnosis (es), discharge prescription (s), and instructions in the Epic ??  record.  If you have any questions please contact [phone].  ??  Nursing notes have been reviewed by the physician/ advanced practice ??  Clinician.

## 2016-09-02 ENCOUNTER — Inpatient Hospital Stay
Admit: 2016-09-02 | Discharge: 2016-09-02 | Disposition: A | Discharge status: Home / Self Care | Payer: Self-pay | Attending: Emergency Medicine

## 2016-09-03 ENCOUNTER — Inpatient Hospital Stay
Admit: 2016-09-03 | Discharge: 2016-09-04 | Disposition: A | Discharge status: Home / Self Care | Payer: PRIVATE HEALTH INSURANCE | Attending: Emergency Medicine

## 2016-09-03 DIAGNOSIS — K529 Noninfective gastroenteritis and colitis, unspecified: Secondary | ICD-10-CM

## 2016-09-03 MED ORDER — SODIUM CHLORIDE 0.9% BOLUS IV
0.9 % | Freq: Once | INTRAVENOUS | Status: AC
Start: 2016-09-03 — End: 2016-09-03
  Administered 2016-09-04: via INTRAVENOUS

## 2016-09-03 MED ORDER — MORPHINE 2 MG/ML INJECTION
2 mg/mL | Freq: Once | INTRAMUSCULAR | Status: AC
Start: 2016-09-03 — End: 2016-09-03
  Administered 2016-09-04: via INTRAVENOUS

## 2016-09-03 MED ORDER — SODIUM CHLORIDE 0.9 % IJ SYRG
INTRAMUSCULAR | Status: DC | PRN
Start: 2016-09-03 — End: 2016-09-04

## 2016-09-03 MED ORDER — ONDANSETRON (PF) 4 MG/2 ML INJECTION
4 mg/2 mL | Freq: Once | INTRAMUSCULAR | Status: AC
Start: 2016-09-03 — End: 2016-09-03
  Administered 2016-09-04: via INTRAVENOUS

## 2016-09-03 MED FILL — BD POSIFLUSH NORMAL SALINE 0.9 % INJECTION SYRINGE: INTRAMUSCULAR | Qty: 10

## 2016-09-03 MED FILL — SODIUM CHLORIDE 0.9 % IV: INTRAVENOUS | Qty: 1000

## 2016-09-03 MED FILL — ONDANSETRON (PF) 4 MG/2 ML INJECTION: 4 mg/2 mL | INTRAMUSCULAR | Qty: 2

## 2016-09-03 MED FILL — MORPHINE 2 MG/ML INJECTION: 2 mg/mL | INTRAMUSCULAR | Qty: 2

## 2016-09-03 NOTE — ED Notes (Signed)
Both written and verbal discharge instructions given to pt with verbalized understanding of home care and follow up. Prescription given. Pt has ride home with [employer].

## 2016-09-03 NOTE — ED Triage Notes (Signed)
Patient states lower abdominal pain with formed bloody stools x couple days.  She states rectal pain and bleeding. States prior hx of colitis.

## 2016-09-03 NOTE — ED Provider Notes (Signed)
EMERGENCY DEPARTMENT HISTORY AND PHYSICAL EXAM    7:36 PM      Date: 09/03/2016  Patient Name: Katie Dixon    History of Presenting Illness     Chief Complaint   Patient presents with   ??? Abdominal Pain   ??? Rectal Bleeding   ??? Rectal Pain         History Provided By: Patient    Chief Complaint: Abdominal Pain; Rectal Pain; Rectal Bleeding; Blood in Stool   Duration:  Days  Timing:  Intermittent  Location: Lower abdomen   Quality: N/A  Severity: N/A  Modifying Factors: Relief with steroids with similar symptoms in the past   Associated Symptoms: denies any other associated signs or symptoms      Additional History (Context): Katie Dixon is a 51 y.o. female with PMHx of colitis presenting to the ED c/o intermittent episodes of lower abdominal pain, rectal pain, rectal bleeding, and blood in stools for the past several days. States today's episode started 5 hours ago. Reports PMHx of colitis. States she had a colonoscopy 2 months that showed "rectal ulcers." Notes previous history of similar symptoms and had relief with steroids. States she is here visiting from [place]. Reports she has called her GI doctor and has an appointment scheduled for next week. Denies any other symptoms or complaints.     PCP: PROVIDER UNKNOWN    Current Facility-Administered Medications   Medication Dose Route Frequency Provider Last Rate Last Dose   ??? sodium chloride (NS) flush 5-10 mL  5-10 mL IntraVENous PRN Zella Ball, MD         Current Outpatient Prescriptions   Medication Sig Dispense Refill   ??? predniSONE (DELTASONE) 50 mg tablet Take 1 Tab by mouth daily for 4 days. 4 Tab 0   ??? predniSONE (DELTASONE) 50 mg tablet Take 1 Tab by mouth daily for 4 days. 4 Tab 0   ??? oxyCODONE-acetaminophen (PERCOCET) 5-325 mg per tablet Take 1 Tab by mouth every four (4) hours as needed for Pain. Max Daily Amount: 6 Tabs. 10 Tab 0   ??? ALPRAZolam (XANAX XR) 2 mg XR tablet Take  by mouth every morning.      ??? lansoprazole (PREVACID) 30 mg capsule Take  by mouth Daily (before breakfast).     ??? simvastatin (ZOCOR) 40 mg tablet Take  by mouth nightly.     ??? DULoxetine (CYMBALTA) 60 mg capsule Take 60 mg by mouth daily.     ??? mesalamine (CANASA) 1,000 mg suppository Insert 1 Suppository into rectum nightly for 7 days. 7 Suppository 0       Past History     Past Medical History:  Past Medical History:   Diagnosis Date   ??? Acid reflux    ??? Anemia    ??? Anxiety    ??? Colitis    ??? Depression    ??? High cholesterol        Past Surgical History:  Past Surgical History:   Procedure Laterality Date   ??? HX APPENDECTOMY     ??? HX CHOLECYSTECTOMY     ??? HX HYSTERECTOMY         Family History:  History reviewed. No pertinent family history.    Social History:  Social History   Substance Use Topics   ??? Smoking status: Former Smoker   ??? Smokeless tobacco: Never Used   ??? Alcohol use No       Allergies:  Allergies   Allergen Reactions   ???  Keflex [Cephalexin] Rash   ??? Toradol [Ketorolac] Hives         Review of Systems       Review of Systems   Constitutional: Negative for chills and fever.   Respiratory: Negative for shortness of breath.    Cardiovascular: Negative for chest pain.   Gastrointestinal: Positive for abdominal pain, anal bleeding, blood in stool and rectal pain. Negative for diarrhea, nausea and vomiting.   All other systems reviewed and are negative.        Physical Exam     Visit Vitals   ??? BP (!) 145/94   ??? Pulse (!) 115   ??? Temp 99.3 ??F (37.4 ??C)   ??? Resp 20   ??? Ht 5' 4"  (1.626 m)   ??? Wt 113.4 kg (250 lb)   ??? SpO2 96%   ??? BMI 42.91 kg/m2         Physical Exam   Constitutional: She is oriented to person, place, and time. She appears well-developed and well-nourished. No distress.   HENT:   Head: Normocephalic and atraumatic.   Eyes: Conjunctivae and EOM are normal. Right eye exhibits no discharge. Left eye exhibits no discharge. No scleral icterus.   Neck: Normal range of motion. Neck supple. No tracheal deviation present.    Cardiovascular: Regular rhythm and normal heart sounds.  Tachycardia present.    No murmur heard.  Pulmonary/Chest: Effort normal and breath sounds normal. No respiratory distress. She has no wheezes. She has no rales.   Abdominal: Soft. She exhibits no distension. There is generalized tenderness (mild). There is no rebound and no guarding.   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 Small amount of bright red blood in rectum with no ulcerations or lesions.    Musculoskeletal: Normal range of motion. She exhibits no edema or deformity.   Neurological: She is alert and oriented to person, place, and time. No cranial nerve deficit.   Skin: Skin is warm and dry. She is not diaphoretic.   Psychiatric: She has a normal mood and affect. Her behavior is normal. Judgment and thought content normal.         Diagnostic Study Results     Labs -  Recent Results (from the past 12 hour(s))   CBC WITH AUTOMATED DIFF    Collection Time: 09/03/16  8:05 PM   Result Value Ref Range    WBC 6.1 4.6 - 13.2 K/uL    RBC 3.72 (L) 4.20 - 5.30 M/uL    HGB 9.4 (L) 12.0 - 16.0 g/dL    HCT 31.1 (L) 35.0 - 45.0 %    MCV 83.6 74.0 - 97.0 FL    MCH 25.3 24.0 - 34.0 PG    MCHC 30.2 (L) 31.0 - 37.0 g/dL    RDW 14.5 11.6 - 14.5 %    PLATELET 258 135 - 420 K/uL    MPV 9.1 (L) 9.2 - 11.8 FL    NEUTROPHILS 79 (H) 40 - 73 %    LYMPHOCYTES 12 (L) 21 - 52 %    MONOCYTES 7 3 - 10 %    EOSINOPHILS 2 0 - 5 %    BASOPHILS 0 0 - 2 %    ABS. NEUTROPHILS 4.8 1.8 - 8.0 K/UL    ABS. LYMPHOCYTES 0.8 (L) 0.9 - 3.6 K/UL    ABS. MONOCYTES 0.4 0.05 - 1.2 K/UL    ABS. EOSINOPHILS 0.1 0.0 - 0.4 K/UL    ABS. BASOPHILS 0.0  0.0 - 0.06 K/UL    DF AUTOMATED     METABOLIC PANEL, COMPREHENSIVE    Collection Time: 09/03/16  8:05 PM   Result Value Ref Range    Sodium 140 136 - 145 mmol/L    Potassium 3.8 3.5 - 5.5 mmol/L    Chloride 103 100 - 108 mmol/L    CO2 29 21 - 32 mmol/L    Anion gap 8 3.0 - 18 mmol/L    Glucose 172 (H) 74 - 99 mg/dL     BUN 8 7.0 - 18 MG/DL    Creatinine 0.83 0.6 - 1.3 MG/DL    BUN/Creatinine ratio 10 (L) 12 - 20      GFR est AA >60 >60 ml/min/1.10m    GFR est non-AA >60 >60 ml/min/1.735m   Calcium 9.1 8.5 - 10.1 MG/DL    Bilirubin, total 0.2 0.2 - 1.0 MG/DL    ALT (SGPT) 31 13 - 56 U/L    AST (SGOT) 21 15 - 37 U/L    Alk. phosphatase 141 (H) 45 - 117 U/L    Protein, total 7.6 6.4 - 8.2 g/dL    Albumin 3.3 (L) 3.4 - 5.0 g/dL    Globulin 4.3 (H) 2.0 - 4.0 g/dL    A-G Ratio 0.8 0.8 - 1.7     SED RATE (ESR)    Collection Time: 09/03/16  8:05 PM   Result Value Ref Range    Sed rate, automated 65 (H) 0 - 20 mm/hr   URINALYSIS W/ RFLX MICROSCOPIC    Collection Time: 09/03/16  8:50 PM   Result Value Ref Range    Color YELLOW      Appearance CLEAR      Specific gravity >1.030 (H) 1.005 - 1.030    pH (UA) 6.5 5.0 - 8.0      Protein NEGATIVE  NEG mg/dL    Glucose NEGATIVE  NEG mg/dL    Ketone NEGATIVE  NEG mg/dL    Bilirubin NEGATIVE  NEG      Blood SMALL (A) NEG      Urobilinogen 1.0 0.2 - 1.0 EU/dL    Nitrites NEGATIVE  NEG      Leukocyte Esterase TRACE (A) NEG     URINE MICROSCOPIC ONLY    Collection Time: 09/03/16  8:50 PM   Result Value Ref Range    WBC 4 to 10 0 - 4 /hpf    RBC 0 to 3 0 - 5 /hpf    Epithelial cells 1+ 0 - 5 /lpf       Radiologic Studies -   No orders to display         Medical Decision Making   I am the first provider for this patient.    I reviewed the vital signs, available nursing notes, past medical history, past surgical history, family history and social history.    Vital Signs-Reviewed the patient's vital signs.    Records Reviewed: Nursing Notes and Old Medical Records (Time of Review: 7:36 PM)    Provider Notes (Medical Decision Making): Pt w/ rectal bleeding, elevated SED rate, likely due to colitis flare. Had CT 2 days ago so this will not be repeated. Pain has been controlled here. H&H is stable. Will send home with course of steroids.      Diagnosis     Clinical Impression:   1. Rectal bleeding     2. Colitis, acute        Disposition: Discharged  Follow-up Information     Follow up With Details Comments [facility]  If symptoms worsen [place] [id number]  [phone]           Patient's Medications   Start Taking    OXYCODONE-ACETAMINOPHEN (PERCOCET) 5-325 MG PER TABLET    Take 1 Tab by mouth every four (4) hours as needed for Pain. Max Daily Amount: 6 Tabs.    PREDNISONE (DELTASONE) 50 MG TABLET    Take 1 Tab by mouth daily for 4 days.    PREDNISONE (DELTASONE) 50 MG TABLET    Take 1 Tab by mouth daily for 4 days.   Continue Taking    ALPRAZOLAM (XANAX XR) 2 MG XR TABLET    Take  by mouth every morning.    DULOXETINE (CYMBALTA) 60 MG CAPSULE    Take 60 mg by mouth daily.    LANSOPRAZOLE (PREVACID) 30 MG CAPSULE    Take  by mouth Daily (before breakfast).    MESALAMINE (CANASA) 1,000 MG SUPPOSITORY    Insert 1 Suppository into rectum nightly for 7 days.    SIMVASTATIN (ZOCOR) 40 MG TABLET    Take  by mouth nightly.   These Medications have changed    No medications on file   Stop Taking    No medications on file     _______________________________    Attestations:  Scribe Attestation     Arboriculturist acting as a Education administrator for and in the presence of Zella Ball, MD      September 03, 2016 at 7:36 PM       Provider Attestation:      I personally performed the services described in the documentation, reviewed the documentation, as recorded by the scribe in my presence, and it accurately and completely records my words and actions. September 03, 2016 at 7:36 PM - Zella Ball, MD    _______________________________

## 2016-09-04 LAB — URINALYSIS W/ RFLX MICROSCOPIC
Bilirubin: NEGATIVE
Glucose: NEGATIVE mg/dL
Ketone: NEGATIVE mg/dL
Nitrites: NEGATIVE
Protein: NEGATIVE mg/dL
Specific gravity: >1.03 — ABNORMAL HIGH (ref 1.005–1.030)
Urobilinogen: 1 EU/dL (ref 0.2–1.0)
pH (UA): 6.5 (ref 5.0–8.0)

## 2016-09-04 LAB — METABOLIC PANEL, COMPREHENSIVE
A-G Ratio: 0.8 (ref 0.8–1.7)
ALT (SGPT): 31 U/L (ref 13–56)
AST (SGOT): 21 U/L (ref 15–37)
Albumin: 3.3 g/dL — ABNORMAL LOW (ref 3.4–5.0)
Alk. phosphatase: 141 U/L — ABNORMAL HIGH (ref 45–117)
Anion gap: 8 mmol/L (ref 3.0–18)
BUN/Creatinine ratio: 10 — ABNORMAL LOW (ref 12–20)
BUN: 8 MG/DL (ref 7.0–18)
Bilirubin, total: 0.2 MG/DL (ref 0.2–1.0)
CO2: 29 mmol/L (ref 21–32)
Calcium: 9.1 MG/DL (ref 8.5–10.1)
Chloride: 103 mmol/L (ref 100–108)
Creatinine: 0.83 MG/DL (ref 0.6–1.3)
GFR est AA: >60 mL/min/{1.73_m2} (ref >60)
GFR est non-AA: >60 mL/min/{1.73_m2} (ref >60)
Globulin: 4.3 g/dL — ABNORMAL HIGH (ref 2.0–4.0)
Glucose: 172 mg/dL — ABNORMAL HIGH (ref 74–99)
Potassium: 3.8 mmol/L (ref 3.5–5.5)
Protein, total: 7.6 g/dL (ref 6.4–8.2)
Sodium: 140 mmol/L (ref 136–145)

## 2016-09-04 LAB — URINE MICROSCOPIC ONLY
RBC: 0 /hpf (ref 0–5)
WBC: 4 /hpf (ref 0–4)

## 2016-09-04 LAB — CBC WITH AUTOMATED DIFF
ABS. BASOPHILS: 0 10*3/uL (ref 0.0–0.06)
ABS. EOSINOPHILS: 0.1 10*3/uL (ref 0.0–0.4)
ABS. LYMPHOCYTES: 0.8 10*3/uL — ABNORMAL LOW (ref 0.9–3.6)
ABS. MONOCYTES: 0.4 10*3/uL (ref 0.05–1.2)
ABS. NEUTROPHILS: 4.8 10*3/uL (ref 1.8–8.0)
BASOPHILS: 0 % (ref 0–2)
EOSINOPHILS: 2 % (ref 0–5)
HCT: 31.1 % — ABNORMAL LOW (ref 35.0–45.0)
HGB: 9.4 g/dL — ABNORMAL LOW (ref 12.0–16.0)
LYMPHOCYTES: 12 % — ABNORMAL LOW (ref 21–52)
MCH: 25.3 PG (ref 24.0–34.0)
MCHC: 30.2 g/dL — ABNORMAL LOW (ref 31.0–37.0)
MCV: 83.6 FL (ref 74.0–97.0)
MONOCYTES: 7 % (ref 3–10)
MPV: 9.1 FL — ABNORMAL LOW (ref 9.2–11.8)
NEUTROPHILS: 79 % — ABNORMAL HIGH (ref 40–73)
PLATELET: 258 10*3/uL (ref 135–420)
RBC: 3.72 M/uL — ABNORMAL LOW (ref 4.20–5.30)
RDW: 14.5 % (ref 11.6–14.5)
WBC: 6.1 10*3/uL (ref 4.6–13.2)

## 2016-09-04 LAB — C REACTIVE PROTEIN, QT: C-Reactive protein: 2.5 mg/dL — ABNORMAL HIGH (ref 0–0.3)

## 2016-09-04 LAB — SED RATE (ESR): Sed rate, automated: 65 mm/hr — ABNORMAL HIGH (ref 0–20)

## 2016-09-04 MED ORDER — PREDNISONE 50 MG TAB
50 mg | ORAL_TABLET | Freq: Every day | ORAL | 0 refills | Status: AC
Start: 2016-09-04 — End: 2016-09-07

## 2016-09-04 MED ORDER — MORPHINE 2 MG/ML INJECTION
2 mg/mL | Freq: Once | INTRAMUSCULAR | Status: AC
Start: 2016-09-04 — End: 2016-09-03
  Administered 2016-09-04: 01:00:00 via INTRAVENOUS

## 2016-09-04 MED ORDER — OXYCODONE-ACETAMINOPHEN 5 MG-325 MG TAB
5-325 mg | ORAL_TABLET | ORAL | 0 refills | Status: AC | PRN
Start: 2016-09-04 — End: ?

## 2016-09-04 MED ORDER — METHYLPREDNISOLONE (PF) 125 MG/2 ML IJ SOLR
125 mg/2 mL | INTRAMUSCULAR | Status: AC
Start: 2016-09-04 — End: 2016-09-03
  Administered 2016-09-04: 01:00:00 via INTRAVENOUS

## 2016-09-04 MED FILL — SOLU-MEDROL (PF) 125 MG/2 ML SOLUTION FOR INJECTION: 125 mg/2 mL | INTRAMUSCULAR | Qty: 2

## 2016-09-04 MED FILL — MORPHINE 2 MG/ML INJECTION: 2 mg/mL | INTRAMUSCULAR | Qty: 2

## 2016-09-18 ENCOUNTER — Emergency Department
Admit: 2016-09-18 | Discharge: 2016-09-19 | Disposition: A | Discharge status: Home Health | Payer: Commercial Managed Care - PPO

## 2016-09-18 MED ORDER — ONDANSETRON HCL (PF) 4 MG/2 ML IJ SOLN
4 mg | INTRAVENOUS | 0 refills | Status: DC | PRN
Start: 2016-09-18 — End: 2016-09-19
  Administered 2016-09-19: 02:00:00 4 mg via INTRAVENOUS

## 2016-09-18 MED ORDER — SODIUM CHLORIDE 0.9 % IV SOLP
1000 mL | INTRAVENOUS | 0 refills | Status: CP
Start: 2016-09-18 — End: ?
  Administered 2016-09-19: 02:00:00 1000 mL via INTRAVENOUS

## 2016-09-19 DIAGNOSIS — Z5320 Procedure and treatment not carried out because of patient's decision for unspecified reasons: ICD-10-CM

## 2016-09-19 DIAGNOSIS — K625 Hemorrhage of anus and rectum: Principal | ICD-10-CM

## 2016-09-19 LAB — URINALYSIS DIPSTICK POC
Lab: 1 (ref 1.003–1.035)
Lab: 6.5 (ref 5.0–8.0)
Lab: NEGATIVE
Lab: NEGATIVE
Lab: NEGATIVE
Lab: NEGATIVE

## 2016-09-19 LAB — CBC AND DIFF
Lab: 0 % — ABNORMAL LOW (ref >60)
Lab: 0 10*3/uL (ref 0–0.20)
Lab: 0.4 10*3/uL (ref 0–0.80)
Lab: 0.6 10*3/uL — ABNORMAL LOW (ref 1.0–4.8)
Lab: 13 % — ABNORMAL LOW (ref 24–44)
Lab: 14 % — ABNORMAL LOW (ref 11–15)
Lab: 25 pg — ABNORMAL LOW (ref 26–34)
Lab: 267 10*3/uL (ref 150–400)
Lab: 28 % — ABNORMAL LOW (ref 36–45)
Lab: 3.5 M/UL — ABNORMAL LOW (ref 4.0–5.0)
Lab: 3.6 K/UL (ref >60)
Lab: 32 g/dL (ref 32.0–36.0)
Lab: 4 % (ref 0–5)
Lab: 4.8 K/UL — ABNORMAL LOW (ref 4.5–11.0)
Lab: 7 FL (ref 7–11)
Lab: 74 % (ref 41–77)
Lab: 78 FL — ABNORMAL LOW (ref 80–100)
Lab: 9 % (ref 4–12)
Lab: 9.1 g/dL — ABNORMAL LOW (ref 12.0–15.0)

## 2016-09-19 LAB — URINALYSIS DIPSTICK
Lab: NEGATIVE
Lab: NEGATIVE
Lab: NEGATIVE

## 2016-09-19 LAB — URINALYSIS, MICROSCOPIC

## 2016-09-19 LAB — COMPREHENSIVE METABOLIC PANEL: Lab: 136 MMOL/L — ABNORMAL LOW (ref 137–147)

## 2016-09-19 NOTE — ED Notes
This RN to bedside.  Pt A&OX4, speech clear.  Pt updatd on plan of care at this time, pt verbalizes understanding.  Pt states that lower abdominal pain is 8/10

## 2016-09-19 NOTE — ED Notes
Report given to Ashley, RN, who will assume care of patient at this time.

## 2016-09-19 NOTE — ED Notes
Pt unhooked herself from the monitors at 2154.

## 2016-09-19 NOTE — ED Notes
It was reported to this RN that pt left AMA after pulling out her IV.  Pt's last known was A&Ox4, speech clear.  Walked with a steady gait to the restroom several times during her stay.

## 2016-09-19 NOTE — ED Notes
Pt here today from home c/c 8-9 episode of bloody loose stool since 0830 today.  Pt reports that she has had this happen before a few months ago and was hospitalized.  Pt reports lower abdominal pain and cramping that started the same time as the loose stools.  Pt also has hx of anemia and requires iron IV infusions to treat and hx of diverticulosis.  Pt A&Ox4, speech clear.

## 2016-09-19 NOTE — ED Notes
Another RN attempted * 2.  Total 5 attempts.  No one available to place with US.

## 2016-09-19 NOTE — ED Notes
IV attempted * 3.  Will notify charge of need for US.

## 2016-09-19 NOTE — ED Notes
Belongings: shirt, bra, pants, underwear, cell phone, sandals.

## 2016-10-25 ENCOUNTER — Emergency Department
Admit: 2016-10-25 | Discharge: 2016-10-25 | Discharge status: Against Medical Advice | Payer: Commercial Managed Care - PPO

## 2016-10-25 ENCOUNTER — Encounter: Admit: 2016-10-25 | Discharge: 2016-10-25 | Payer: Commercial Managed Care - PPO

## 2016-10-25 DIAGNOSIS — K626 Ulcer of anus and rectum: ICD-10-CM

## 2016-10-25 DIAGNOSIS — K219 Gastro-esophageal reflux disease without esophagitis: ICD-10-CM

## 2016-10-25 DIAGNOSIS — E785 Hyperlipidemia, unspecified: ICD-10-CM

## 2016-10-25 DIAGNOSIS — R1030 Lower abdominal pain, unspecified: Principal | ICD-10-CM

## 2016-10-25 DIAGNOSIS — K5730 Diverticulosis of large intestine without perforation or abscess without bleeding: ICD-10-CM

## 2016-10-25 DIAGNOSIS — K760 Fatty (change of) liver, not elsewhere classified: ICD-10-CM

## 2016-10-25 DIAGNOSIS — F419 Anxiety disorder, unspecified: Principal | ICD-10-CM

## 2016-10-25 DIAGNOSIS — K429 Umbilical hernia without obstruction or gangrene: ICD-10-CM

## 2016-10-25 DIAGNOSIS — D649 Anemia, unspecified: ICD-10-CM

## 2016-10-25 DIAGNOSIS — N823 Fistula of vagina to large intestine: ICD-10-CM

## 2016-10-25 MED ORDER — SODIUM CHLORIDE 0.9 % IV SOLP
1000 mL | INTRAVENOUS | 0 refills | Status: DC
Start: 2016-10-25 — End: 2016-10-26

## 2016-10-25 NOTE — ED Notes
Dr. Maisie Fus at bedisde to explain plan to pt.  Pt unhappy with plan and shouts "What did I do to this hospital?" Pt proceeds to walk out of room with belongings and states "You can take your bentyl and shove it." Pt unagreeable to wait for AMA form.

## 2016-10-25 NOTE — ED Notes
51 yo F presents to ED 10 with c/o LLQ abdominal pain that started at 1400 today.  Pt reports that the pain is 9/10 sharp and stabbing and that she has never experienced pain like this before. Pt reports that she has felt nausea but no vomiting.  Pt denies diarrhea or constipation. Denies frequency or pain with urination. Denies CP, SOA.    Pt AOx4, VSS, respirations even and unlabored on RA, skin warm/dry, pt resting on cart with call light in reach, RN awaiting further orders.

## 2017-02-05 ENCOUNTER — Emergency Department: Admit: 2017-02-05 | Discharge: 2017-02-05 | Payer: Commercial Managed Care - PPO | Attending: Emergency Medical Services

## 2017-02-05 ENCOUNTER — Encounter: Admit: 2017-02-05 | Discharge: 2017-02-05 | Payer: Commercial Managed Care - PPO

## 2017-02-05 DIAGNOSIS — N823 Fistula of vagina to large intestine: ICD-10-CM

## 2017-02-05 DIAGNOSIS — F419 Anxiety disorder, unspecified: Principal | ICD-10-CM

## 2017-02-05 DIAGNOSIS — K626 Ulcer of anus and rectum: ICD-10-CM

## 2017-02-05 DIAGNOSIS — K429 Umbilical hernia without obstruction or gangrene: ICD-10-CM

## 2017-02-05 DIAGNOSIS — D649 Anemia, unspecified: ICD-10-CM

## 2017-02-05 DIAGNOSIS — K5730 Diverticulosis of large intestine without perforation or abscess without bleeding: ICD-10-CM

## 2017-02-05 DIAGNOSIS — K760 Fatty (change of) liver, not elsewhere classified: ICD-10-CM

## 2017-02-05 DIAGNOSIS — K219 Gastro-esophageal reflux disease without esophagitis: ICD-10-CM

## 2017-02-05 DIAGNOSIS — E785 Hyperlipidemia, unspecified: ICD-10-CM

## 2017-02-05 MED ORDER — ONDANSETRON HCL (PF) 4 MG/2 ML IJ SOLN
4 mg | Freq: Once | INTRAVENOUS | 0 refills | Status: CP
Start: 2017-02-05 — End: ?
  Administered 2017-02-06: 02:00:00 4 mg via INTRAVENOUS

## 2017-02-05 MED ORDER — IOHEXOL 350 MG IODINE/ML IV SOLN
100 mL | Freq: Once | INTRAVENOUS | 0 refills | Status: CP
Start: 2017-02-05 — End: ?
  Administered 2017-02-06: 03:00:00 100 mL via INTRAVENOUS

## 2017-02-05 MED ORDER — FENTANYL CITRATE (PF) 50 MCG/ML IJ SOLN
50-75 ug | INTRAVENOUS | 0 refills | Status: DC | PRN
Start: 2017-02-05 — End: 2017-02-06
  Administered 2017-02-06: 02:00:00 50 ug via INTRAVENOUS

## 2017-02-05 MED ORDER — SODIUM CHLORIDE 0.9 % IJ SOLN
50 mL | Freq: Once | INTRAVENOUS | 0 refills | Status: CP
Start: 2017-02-05 — End: ?
  Administered 2017-02-06: 03:00:00 50 mL via INTRAVENOUS

## 2017-02-05 MED ORDER — LACTATED RINGERS IV SOLP
1000 mL | INTRAVENOUS | 0 refills | Status: CP
Start: 2017-02-05 — End: ?
  Administered 2017-02-06: 02:00:00 1000 mL via INTRAVENOUS

## 2017-02-06 ENCOUNTER — Emergency Department
Admit: 2017-02-06 | Discharge: 2017-02-06 | Disposition: A | Discharge status: Home / Self Care | Payer: Commercial Managed Care - PPO | Attending: Emergency Medical Services

## 2017-02-06 DIAGNOSIS — K921 Melena: ICD-10-CM

## 2017-02-06 DIAGNOSIS — R1032 Left lower quadrant pain: Principal | ICD-10-CM

## 2017-02-06 LAB — CBC AND DIFF
Lab: 0 10*3/uL (ref 0–0.20)
Lab: 0.1 10*3/uL (ref 0–0.45)
Lab: 4.3 10*3/uL — ABNORMAL LOW (ref 4.5–11.0)

## 2017-02-06 LAB — URINALYSIS DIPSTICK
Lab: NEGATIVE
Lab: NEGATIVE
Lab: NEGATIVE
Lab: NEGATIVE
Lab: NEGATIVE
Lab: NEGATIVE

## 2017-02-06 LAB — COMPREHENSIVE METABOLIC PANEL
Lab: 0.1 mg/dL — ABNORMAL LOW (ref 0.3–1.2)
Lab: 0.8 mg/dL — ABNORMAL LOW (ref 0.4–1.00)
Lab: 105 MMOL/L — ABNORMAL LOW (ref 98–110)
Lab: 11 U/L (ref 7–40)
Lab: 136 MMOL/L — ABNORMAL LOW (ref 137–147)
Lab: 15 mg/dL — ABNORMAL LOW (ref 7–25)
Lab: 18 U/L (ref 7–56)
Lab: 213 mg/dL — ABNORMAL HIGH (ref 70–100)
Lab: 23 MMOL/L (ref 21–30)
Lab: 3.9 g/dL (ref 3.5–5.0)
Lab: 6.6 g/dL (ref 6.0–8.0)
Lab: 8 K/UL (ref 3–12)
Lab: 8.6 mg/dL — ABNORMAL HIGH (ref 8.5–10.6)
Lab: 82 U/L — ABNORMAL LOW (ref 25–110)
Lab: >60 mL/min (ref >60)
Lab: >60 mL/min — ABNORMAL LOW (ref >60)

## 2017-02-06 LAB — URINALYSIS, MICROSCOPIC

## 2017-02-06 LAB — LIPASE: Lab: 35 U/L — ABNORMAL LOW (ref 11–82)

## 2017-02-06 LAB — SED RATE: Lab: 14 mm/h (ref 0–20)

## 2017-02-06 LAB — C REACTIVE PROTEIN (CRP): Lab: 0.2 mg/dL (ref <1.0)

## 2017-02-06 NOTE — ED Notes
Pt verbalized understanding of discharge instructions and follow up. Pt ambulated out of department in no acute distress. Pt is A&Ox4, breathing appears non labored.

## 2017-04-05 ENCOUNTER — Emergency Department
Admit: 2017-04-05 | Discharge: 2017-04-05 | Discharge status: Against Medical Advice | Payer: Commercial Managed Care - PPO

## 2017-04-05 DIAGNOSIS — Z87891 Personal history of nicotine dependence: ICD-10-CM

## 2017-04-05 DIAGNOSIS — Z765 Malingerer [conscious simulation]: Secondary | ICD-10-CM

## 2017-04-05 DIAGNOSIS — F419 Anxiety disorder, unspecified: ICD-10-CM

## 2017-04-05 DIAGNOSIS — E785 Hyperlipidemia, unspecified: ICD-10-CM

## 2017-04-05 DIAGNOSIS — R1032 Left lower quadrant pain: Principal | ICD-10-CM

## 2017-06-08 ENCOUNTER — Encounter: Admit: 2017-06-08 | Discharge: 2017-06-08 | Payer: Commercial Managed Care - PPO

## 2017-06-08 DIAGNOSIS — K5730 Diverticulosis of large intestine without perforation or abscess without bleeding: ICD-10-CM

## 2017-06-08 DIAGNOSIS — K626 Ulcer of anus and rectum: ICD-10-CM

## 2017-06-08 DIAGNOSIS — F419 Anxiety disorder, unspecified: Principal | ICD-10-CM

## 2017-06-08 DIAGNOSIS — K429 Umbilical hernia without obstruction or gangrene: ICD-10-CM

## 2017-06-08 DIAGNOSIS — N823 Fistula of vagina to large intestine: ICD-10-CM

## 2017-06-08 DIAGNOSIS — K760 Fatty (change of) liver, not elsewhere classified: ICD-10-CM

## 2017-06-08 DIAGNOSIS — D649 Anemia, unspecified: ICD-10-CM

## 2017-06-08 DIAGNOSIS — E785 Hyperlipidemia, unspecified: ICD-10-CM

## 2017-06-08 DIAGNOSIS — K219 Gastro-esophageal reflux disease without esophagitis: ICD-10-CM

## 2017-08-22 ENCOUNTER — Encounter: Admit: 2017-08-22 | Discharge: 2017-08-22 | Payer: Commercial Managed Care - PPO

## 2017-08-22 DIAGNOSIS — K429 Umbilical hernia without obstruction or gangrene: ICD-10-CM

## 2017-08-22 DIAGNOSIS — K626 Ulcer of anus and rectum: ICD-10-CM

## 2017-08-22 DIAGNOSIS — K760 Fatty (change of) liver, not elsewhere classified: ICD-10-CM

## 2017-08-22 DIAGNOSIS — N823 Fistula of vagina to large intestine: ICD-10-CM

## 2017-08-22 DIAGNOSIS — D649 Anemia, unspecified: ICD-10-CM

## 2017-08-22 DIAGNOSIS — F419 Anxiety disorder, unspecified: Principal | ICD-10-CM

## 2017-08-22 DIAGNOSIS — E785 Hyperlipidemia, unspecified: ICD-10-CM

## 2017-08-22 DIAGNOSIS — K219 Gastro-esophageal reflux disease without esophagitis: ICD-10-CM

## 2017-08-22 DIAGNOSIS — K5730 Diverticulosis of large intestine without perforation or abscess without bleeding: ICD-10-CM

## 2017-08-22 MED ORDER — FENTANYL CITRATE (PF) 50 MCG/ML IJ SOLN
50 ug | Freq: Once | INTRAVENOUS | 0 refills | Status: DC
Start: 2017-08-22 — End: 2017-08-23

## 2017-08-22 MED ORDER — ACETAMINOPHEN 500 MG PO TAB
1000 mg | Freq: Once | ORAL | 0 refills | Status: DC
Start: 2017-08-22 — End: 2017-08-23

## 2017-08-22 MED ORDER — LACTATED RINGERS IV SOLP
1000 mL | INTRAVENOUS | 0 refills | Status: DC
Start: 2017-08-22 — End: 2017-08-23

## 2017-08-23 ENCOUNTER — Emergency Department
Admit: 2017-08-23 | Discharge: 2017-08-23 | Disposition: A | Discharge status: Home / Self Care | Payer: Commercial Managed Care - PPO

## 2017-08-23 ENCOUNTER — Ambulatory Visit: Admit: 2017-08-23 | Discharge: 2017-08-23 | Payer: Commercial Managed Care - PPO

## 2017-08-23 DIAGNOSIS — Z87891 Personal history of nicotine dependence: ICD-10-CM

## 2017-08-23 DIAGNOSIS — R319 Hematuria, unspecified: ICD-10-CM

## 2017-08-23 DIAGNOSIS — R109 Unspecified abdominal pain: Principal | ICD-10-CM

## 2017-08-23 LAB — CBC AND DIFF
Lab: 17 % — ABNORMAL HIGH (ref 11–15)
Lab: 5.8 10*3/uL (ref 4.5–11.0)
Lab: 7 % (ref 4–12)
Lab: 77 % (ref <80)

## 2017-08-23 LAB — URINALYSIS DIPSTICK
Lab: 6 (ref 5.0–8.0)
Lab: NEGATIVE
Lab: NEGATIVE
Lab: NEGATIVE
Lab: NEGATIVE
Lab: NEGATIVE
Lab: NEGATIVE

## 2017-08-23 LAB — COMPREHENSIVE METABOLIC PANEL
Lab: 0.2 mg/dL — ABNORMAL LOW (ref 0.3–1.2)
Lab: 0.8 mg/dL — ABNORMAL LOW (ref 0.4–1.00)
Lab: 106 MMOL/L — ABNORMAL LOW (ref 98–110)
Lab: 12 mg/dL — ABNORMAL LOW (ref 7–25)
Lab: 130 mg/dL — ABNORMAL HIGH (ref 70–100)
Lab: 139 MMOL/L — ABNORMAL LOW (ref 137–147)
Lab: 3.9 MMOL/L — ABNORMAL LOW (ref 3.5–5.1)
Lab: 7.2 g/dL (ref 6.0–8.0)
Lab: >60 mL/min — ABNORMAL LOW (ref >60)

## 2017-08-23 LAB — URINALYSIS, MICROSCOPIC

## 2019-02-18 ENCOUNTER — Emergency Department: Admit: 2019-02-18 | Discharge: 2019-02-18 | Payer: Commercial Managed Care - PPO

## 2019-02-18 ENCOUNTER — Encounter: Admit: 2019-02-18 | Discharge: 2019-02-18 | Payer: Commercial Managed Care - PPO

## 2019-02-18 DIAGNOSIS — F419 Anxiety disorder, unspecified: Secondary | ICD-10-CM

## 2019-02-18 DIAGNOSIS — D649 Anemia, unspecified: Secondary | ICD-10-CM

## 2019-02-18 DIAGNOSIS — N823 Fistula of vagina to large intestine: Secondary | ICD-10-CM

## 2019-02-18 DIAGNOSIS — K6289 Other specified diseases of anus and rectum: Secondary | ICD-10-CM

## 2019-02-18 DIAGNOSIS — K429 Umbilical hernia without obstruction or gangrene: Secondary | ICD-10-CM

## 2019-02-18 DIAGNOSIS — E785 Hyperlipidemia, unspecified: Secondary | ICD-10-CM

## 2019-02-18 DIAGNOSIS — R109 Unspecified abdominal pain: Secondary | ICD-10-CM

## 2019-02-18 DIAGNOSIS — K5730 Diverticulosis of large intestine without perforation or abscess without bleeding: Secondary | ICD-10-CM

## 2019-02-18 DIAGNOSIS — K626 Ulcer of anus and rectum: Secondary | ICD-10-CM

## 2019-02-18 DIAGNOSIS — K219 Gastro-esophageal reflux disease without esophagitis: Secondary | ICD-10-CM

## 2019-02-18 DIAGNOSIS — K760 Fatty (change of) liver, not elsewhere classified: Secondary | ICD-10-CM

## 2019-02-18 LAB — CBC AND DIFF
Lab: 266 K/UL — AB (ref 150–400)
Lab: 33 % — ABNORMAL LOW (ref 36–45)
Lab: 4.5 K/UL — ABNORMAL LOW (ref 1.8–7.0)
Lab: 5.6 10*3/uL (ref 4.5–11.0)
Lab: 80 % — ABNORMAL HIGH (ref 41–77)

## 2019-02-18 LAB — COMPREHENSIVE METABOLIC PANEL
Lab: 0.4 mg/dL (ref 0.3–1.2)
Lab: 11 mg/dL (ref 7–25)
Lab: 137 MMOL/L (ref 137–147)
Lab: 4 MMOL/L — ABNORMAL LOW (ref 3.5–5.1)
Lab: 8.9 mg/dL (ref 8.5–10.6)

## 2019-02-18 LAB — URINALYSIS DIPSTICK REFLEX TO CULTURE

## 2019-02-18 LAB — POC LACTATE: Lab: 1.3 MMOL/L (ref 0.5–2.0)

## 2019-02-18 LAB — POC CREATININE, RAD: Lab: 0.6 mg/dL (ref 0.4–1.00)

## 2019-02-18 MED ORDER — MORPHINE 2 MG/ML IV SYRG
4 mg | INTRAVENOUS | 0 refills | Status: DC | PRN
Start: 2019-02-18 — End: 2019-02-19
  Administered 2019-02-18 (×2): 4 mg via INTRAVENOUS

## 2019-02-18 MED ORDER — LACTATED RINGERS IV SOLP
1000 mL | INTRAVENOUS | 0 refills | Status: CP
Start: 2019-02-18 — End: ?
  Administered 2019-02-18: 20:00:00 1000 mL via INTRAVENOUS

## 2019-02-18 MED ORDER — SODIUM CHLORIDE 0.9 % IJ SOLN
50 mL | Freq: Once | INTRAVENOUS | 0 refills | Status: CP
Start: 2019-02-18 — End: ?
  Administered 2019-02-18: 21:00:00 50 mL via INTRAVENOUS

## 2019-02-18 MED ORDER — IOHEXOL 350 MG IODINE/ML IV SOLN
100 mL | Freq: Once | INTRAVENOUS | 0 refills | Status: CP
Start: 2019-02-18 — End: ?
  Administered 2019-02-18: 21:00:00 100 mL via INTRAVENOUS

## 2019-02-18 MED ORDER — ONDANSETRON HCL (PF) 4 MG/2 ML IJ SOLN
4 mg | Freq: Once | INTRAVENOUS | 0 refills | Status: CP
Start: 2019-02-18 — End: ?
  Administered 2019-02-18: 20:00:00 4 mg via INTRAVENOUS

## 2019-02-18 MED ORDER — ONDANSETRON 4 MG PO TBDI
4 mg | ORAL_TABLET | ORAL | 0 refills | 8.00000 days | Status: AC | PRN
Start: 2019-02-18 — End: ?

## 2019-02-18 NOTE — ED Notes
53 yo female presents to the ED per POV with a history of diverticulitis c/o acute onset of LLQ abd pain starting at 0100.  She states the pain is severe, stabbing and constant and is similar to diverticulitis pain she has had in the past.  She states she had a normal small BM this morning.  She c/o associated nausea and has vomited x3.  No blood seen in emesis or stool.  She denies dysuria.  She states the last time she ate was late yesterday afternoon and it was a grilled cheese and chicken soup.  Patient is awake, alert, oriented x 3.  She is ambulatory and sitting in a recliner in results waiting area for eval and work up.  Report to Will Bonnet RN.

## 2019-02-18 NOTE — ED Notes
This RN provides discharge teaching and instructions. Pt AOx4, verbalizes understanding and denies further questions. Pt ambulates off unit with an even, steady gait in no acute distress. All belongings and paperwork in patient possession upon departure.

## 2019-03-06 ENCOUNTER — Encounter: Admit: 2019-03-06 | Discharge: 2019-03-06 | Payer: Commercial Managed Care - PPO

## 2019-03-06 ENCOUNTER — Emergency Department: Admit: 2019-03-06 | Discharge: 2019-03-06 | Payer: Commercial Managed Care - PPO

## 2019-03-06 DIAGNOSIS — N823 Fistula of vagina to large intestine: Secondary | ICD-10-CM

## 2019-03-06 DIAGNOSIS — K5730 Diverticulosis of large intestine without perforation or abscess without bleeding: Secondary | ICD-10-CM

## 2019-03-06 DIAGNOSIS — K429 Umbilical hernia without obstruction or gangrene: Secondary | ICD-10-CM

## 2019-03-06 DIAGNOSIS — R079 Chest pain, unspecified: Secondary | ICD-10-CM

## 2019-03-06 DIAGNOSIS — K760 Fatty (change of) liver, not elsewhere classified: Secondary | ICD-10-CM

## 2019-03-06 DIAGNOSIS — R109 Unspecified abdominal pain: Secondary | ICD-10-CM

## 2019-03-06 DIAGNOSIS — F419 Anxiety disorder, unspecified: Secondary | ICD-10-CM

## 2019-03-06 DIAGNOSIS — D649 Anemia, unspecified: Secondary | ICD-10-CM

## 2019-03-06 DIAGNOSIS — E785 Hyperlipidemia, unspecified: Secondary | ICD-10-CM

## 2019-03-06 DIAGNOSIS — K219 Gastro-esophageal reflux disease without esophagitis: Secondary | ICD-10-CM

## 2019-03-06 DIAGNOSIS — K626 Ulcer of anus and rectum: Secondary | ICD-10-CM

## 2019-03-06 LAB — COMPREHENSIVE METABOLIC PANEL
Lab: 0.5 mg/dL (ref 0.3–1.2)
Lab: 0.6 mg/dL (ref 0.4–1.00)
Lab: 10 mg/dL (ref 7–25)
Lab: 101 U/L — ABNORMAL LOW (ref 25–110)
Lab: 129 mg/dL — ABNORMAL HIGH (ref 70–100)
Lab: 14 U/L (ref 7–40)
Lab: 28 MMOL/L (ref 21–30)
Lab: 3.7 MMOL/L — ABNORMAL LOW (ref 3.5–5.1)
Lab: 3.8 g/dL — ABNORMAL HIGH (ref 3.5–5.0)
Lab: 6.4 g/dL (ref 6.0–8.0)
Lab: 8.9 mg/dL (ref 8.5–10.6)
Lab: 9 10*3/uL (ref 3–12)

## 2019-03-06 LAB — POC CREATININE, RAD: Lab: 0.6 mg/dL (ref 0.4–1.00)

## 2019-03-06 LAB — CBC AND DIFF
Lab: 1 % (ref 0–2)
Lab: 29 % — ABNORMAL LOW (ref 36–45)
Lab: 3.6 M/UL — ABNORMAL LOW (ref 4.0–5.0)
Lab: 5.2 K/UL (ref 4.5–11.0)

## 2019-03-06 MED ORDER — SODIUM CHLORIDE 0.9 % IJ SOLN
50 mL | Freq: Once | INTRAVENOUS | 0 refills | Status: CP
Start: 2019-03-06 — End: ?
  Administered 2019-03-06: 21:00:00 50 mL via INTRAVENOUS

## 2019-03-06 MED ORDER — IBUPROFEN 800 MG PO TAB
800 mg | Freq: Once | ORAL | 0 refills | Status: DC
Start: 2019-03-06 — End: 2019-03-06

## 2019-03-06 MED ORDER — ONDANSETRON HCL (PF) 4 MG/2 ML IJ SOLN
4 mg | Freq: Once | INTRAVENOUS | 0 refills | Status: CP
Start: 2019-03-06 — End: ?
  Administered 2019-03-06: 20:00:00 4 mg via INTRAVENOUS

## 2019-03-06 MED ORDER — SODIUM CHLORIDE 0.9 % IJ SOLN
50 mL | Freq: Once | INTRAVENOUS | 0 refills | Status: CP
Start: 2019-03-06 — End: ?
  Administered 2019-03-06: 23:00:00 50 mL via INTRAVENOUS

## 2019-03-06 MED ORDER — MORPHINE 4 MG/ML IV SYRG
6 mg | Freq: Once | INTRAVENOUS | 0 refills | Status: CP
Start: 2019-03-06 — End: ?
  Administered 2019-03-06: 22:00:00 6 mg via INTRAVENOUS

## 2019-03-06 MED ORDER — IOHEXOL 350 MG IODINE/ML IV SOLN
70 mL | Freq: Once | INTRAVENOUS | 0 refills | Status: CP
Start: 2019-03-06 — End: ?
  Administered 2019-03-06: 23:00:00 70 mL via INTRAVENOUS

## 2019-03-06 MED ORDER — IOHEXOL 350 MG IODINE/ML IV SOLN
100 mL | Freq: Once | INTRAVENOUS | 0 refills | Status: CP
Start: 2019-03-06 — End: ?
  Administered 2019-03-06: 21:00:00 100 mL via INTRAVENOUS

## 2019-03-06 MED ORDER — METHOCARBAMOL 500 MG PO TAB
1000 mg | Freq: Once | ORAL | 0 refills | Status: DC
Start: 2019-03-06 — End: 2019-03-06

## 2019-03-06 MED ORDER — MORPHINE 2 MG/ML IV SYRG
4 mg | Freq: Once | INTRAVENOUS | 0 refills | Status: CP
Start: 2019-03-06 — End: ?
  Administered 2019-03-06: 20:00:00 4 mg via INTRAVENOUS

## 2019-03-06 MED ORDER — LIDOCAINE 5 % TP PTMD
1 | MEDICATED_PATCH | TOPICAL | 0 refills | 30.00000 days | Status: AC
Start: 2019-03-06 — End: ?
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
## 2019-03-06 NOTE — ED Notes
Pt ambulated to restroom independently. Pt c/o pain. States morphine had little improvement

## 2019-03-07 NOTE — ED Notes
Pt given d/c instructions. Verbalizes understanding. No further questions or concerns at this time.

## 2019-03-28 ENCOUNTER — Encounter: Admit: 2019-03-28 | Discharge: 2019-03-28 | Payer: BC Managed Care – PPO

## 2019-03-28 ENCOUNTER — Emergency Department: Admit: 2019-03-28 | Discharge: 2019-03-28 | Payer: BC Managed Care – PPO

## 2019-03-28 DIAGNOSIS — K5730 Diverticulosis of large intestine without perforation or abscess without bleeding: Secondary | ICD-10-CM

## 2019-03-28 DIAGNOSIS — E785 Hyperlipidemia, unspecified: Secondary | ICD-10-CM

## 2019-03-28 DIAGNOSIS — F419 Anxiety disorder, unspecified: Secondary | ICD-10-CM

## 2019-03-28 DIAGNOSIS — K760 Fatty (change of) liver, not elsewhere classified: Secondary | ICD-10-CM

## 2019-03-28 DIAGNOSIS — K219 Gastro-esophageal reflux disease without esophagitis: Secondary | ICD-10-CM

## 2019-03-28 DIAGNOSIS — N823 Fistula of vagina to large intestine: Secondary | ICD-10-CM

## 2019-03-28 DIAGNOSIS — K429 Umbilical hernia without obstruction or gangrene: Secondary | ICD-10-CM

## 2019-03-28 DIAGNOSIS — D649 Anemia, unspecified: Secondary | ICD-10-CM

## 2019-03-28 DIAGNOSIS — K626 Ulcer of anus and rectum: Secondary | ICD-10-CM

## 2019-03-28 DIAGNOSIS — R0789 Other chest pain: Secondary | ICD-10-CM

## 2019-03-28 LAB — COMPREHENSIVE METABOLIC PANEL
Lab: 0.3 mg/dL (ref 0.3–1.2)
Lab: 0.6 mg/dL (ref 0.4–1.00)
Lab: 104 MMOL/L — ABNORMAL LOW (ref 98–110)
Lab: 11 U/L (ref 7–40)
Lab: 12 U/L (ref 7–56)
Lab: 133 U/L — ABNORMAL HIGH (ref 25–110)
Lab: 139 MMOL/L — ABNORMAL LOW (ref 137–147)
Lab: 222 mg/dL — ABNORMAL HIGH (ref 70–100)
Lab: 24 MMOL/L (ref 21–30)
Lab: 3.7 g/dL (ref 3.5–5.0)
Lab: 3.9 MMOL/L — ABNORMAL LOW (ref 3.5–5.1)
Lab: 6.5 g/dL (ref 6.0–8.0)
Lab: 8 mg/dL — ABNORMAL LOW (ref 7–25)
Lab: 9.1 mg/dL — ABNORMAL HIGH (ref 8.5–10.6)
Lab: >60 mL/min (ref >60)
Lab: >60 mL/min (ref >60)
Lab: 11 (ref 3–12)

## 2019-03-28 LAB — CBC: Lab: 5.6 10*3/uL (ref 4.5–11.0)

## 2019-03-28 LAB — POC TROPONIN: Lab: 0 ng/mL — ABNORMAL LOW (ref >60)

## 2019-03-28 MED ORDER — SODIUM CHLORIDE 0.9 % IJ SOLN
50 mL | Freq: Once | INTRAVENOUS | 0 refills | Status: CP
Start: 2019-03-28 — End: ?
  Administered 2019-03-28: 19:00:00 50 mL via INTRAVENOUS

## 2019-03-28 MED ORDER — AZITHROMYCIN 250 MG PO TAB
ORAL_TABLET | 0 refills | Status: AC
Start: 2019-03-28 — End: ?

## 2019-03-28 MED ORDER — ONDANSETRON HCL (PF) 4 MG/2 ML IJ SOLN
4 mg | Freq: Once | INTRAVENOUS | 0 refills | Status: CP
Start: 2019-03-28 — End: ?
  Administered 2019-03-28: 21:00:00 4 mg via INTRAVENOUS

## 2019-03-28 MED ORDER — ONDANSETRON HCL (PF) 4 MG/2 ML IJ SOLN
4 mg | Freq: Once | INTRAVENOUS | 0 refills | Status: CP
Start: 2019-03-28 — End: ?

## 2019-03-28 MED ORDER — IOHEXOL 350 MG IODINE/ML IV SOLN
80 mL | Freq: Once | INTRAVENOUS | 0 refills | Status: CP
Start: 2019-03-28 — End: ?
  Administered 2019-03-28: 19:00:00 80 mL via INTRAVENOUS

## 2019-03-28 MED ORDER — HYDROMORPHONE (PF) 2 MG/ML IJ SYRG
.5 mg | Freq: Once | INTRAVENOUS | 0 refills | Status: CP
Start: 2019-03-28 — End: ?
  Administered 2019-03-28: 21:00:00 0.5 mg via INTRAVENOUS

## 2019-03-28 MED ORDER — MORPHINE 4 MG/ML IV SYRG
6 mg | Freq: Once | INTRAVENOUS | 0 refills | Status: CP
Start: 2019-03-28 — End: ?
  Administered 2019-03-28: 19:00:00 6 mg via INTRAVENOUS

## 2019-03-28 MED ORDER — ONDANSETRON HCL (PF) 4 MG/2 ML IJ SOLN
4 mg | Freq: Once | INTRAVENOUS | 0 refills | Status: DC
Start: 2019-03-28 — End: 2019-03-28

## 2019-03-28 MED ORDER — FENTANYL CITRATE (PF) 50 MCG/ML IJ SOLN
75 ug | Freq: Once | INTRAVENOUS | 0 refills | Status: CP
Start: 2019-03-28 — End: ?
  Administered 2019-03-28: 18:00:00 75 ug via INTRAVENOUS

## 2019-03-28 MED ORDER — HYDROMORPHONE (PF) 2 MG/ML IJ SYRG
1 mg | Freq: Once | INTRAVENOUS | 0 refills | Status: DC
Start: 2019-03-28 — End: 2019-03-28

## 2019-03-28 NOTE — ED Notes
Pt requesting pain medication stating "I usually require dilaudid," Dr. Marzetta Merino notified.

## 2019-03-28 NOTE — ED Notes
Pt observed ambulating to restroom independently
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
## 2019-03-29 NOTE — ED Notes
Pt received discharge instructions, prescriptions, verbalizes understanding with no questions at this time. Pt verbalizes understanding that if they received narcotic pain medication they are not to drive for 24 hours after the last dose. Pt ambulated out of department with steady gait. All belongings with pt at time of discharge.

## 2019-04-09 ENCOUNTER — Emergency Department: Admit: 2019-04-09 | Discharge: 2019-04-09 | Payer: BC Managed Care – PPO

## 2019-04-09 ENCOUNTER — Encounter: Admit: 2019-04-09 | Discharge: 2019-04-09 | Payer: BC Managed Care – PPO

## 2019-04-09 DIAGNOSIS — R0789 Other chest pain: Secondary | ICD-10-CM

## 2019-04-09 DIAGNOSIS — G8918 Other acute postprocedural pain: Secondary | ICD-10-CM

## 2019-04-09 MED ORDER — CYCLOBENZAPRINE 10 MG PO TAB
10 mg | Freq: Once | ORAL | 0 refills | Status: DC
Start: 2019-04-09 — End: 2019-04-09

## 2019-04-09 MED ORDER — LIDOCAINE 5 % TP PTMD
2 | Freq: Once | TOPICAL | 0 refills | Status: DC
Start: 2019-04-09 — End: 2019-04-09

## 2019-04-09 MED ORDER — IBUPROFEN 800 MG PO TAB
800 mg | Freq: Once | ORAL | 0 refills | Status: DC
Start: 2019-04-09 — End: 2019-04-09

## 2019-04-09 NOTE — ED Notes
Pt denies CP, but rather endorses sternal incision pain
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
## 2019-04-09 NOTE — ED Notes
Pt ambulated out of ED with steady gate and with all personal belongings. Pt not up for discharge and leaving prior to tx complete. MD notified. Pt VSS prior to Pt disconnecting self from monitor.

## 2019-04-26 ENCOUNTER — Encounter: Admit: 2019-04-26 | Discharge: 2019-04-26 | Payer: BC Managed Care – PPO

## 2019-04-26 ENCOUNTER — Emergency Department: Admit: 2019-04-26 | Discharge: 2019-04-27 | Discharge status: Against Medical Advice | Payer: BC Managed Care – PPO

## 2019-04-27 ENCOUNTER — Encounter: Admit: 2019-04-27 | Discharge: 2019-04-27 | Payer: BC Managed Care – PPO

## 2019-04-27 ENCOUNTER — Emergency Department: Admit: 2019-04-27 | Discharge: 2019-04-27 | Payer: BC Managed Care – PPO

## 2019-04-27 DIAGNOSIS — E785 Hyperlipidemia, unspecified: Secondary | ICD-10-CM

## 2019-04-27 DIAGNOSIS — K429 Umbilical hernia without obstruction or gangrene: Secondary | ICD-10-CM

## 2019-04-27 DIAGNOSIS — N823 Fistula of vagina to large intestine: Secondary | ICD-10-CM

## 2019-04-27 DIAGNOSIS — K760 Fatty (change of) liver, not elsewhere classified: Secondary | ICD-10-CM

## 2019-04-27 DIAGNOSIS — D649 Anemia, unspecified: Secondary | ICD-10-CM

## 2019-04-27 DIAGNOSIS — K5730 Diverticulosis of large intestine without perforation or abscess without bleeding: Secondary | ICD-10-CM

## 2019-04-27 DIAGNOSIS — K219 Gastro-esophageal reflux disease without esophagitis: Secondary | ICD-10-CM

## 2019-04-27 DIAGNOSIS — F419 Anxiety disorder, unspecified: Secondary | ICD-10-CM

## 2019-04-27 DIAGNOSIS — R109 Unspecified abdominal pain: Secondary | ICD-10-CM

## 2019-04-27 DIAGNOSIS — K626 Ulcer of anus and rectum: Secondary | ICD-10-CM

## 2019-04-27 MED ORDER — FENTANYL CITRATE (PF) 50 MCG/ML IJ SOLN
50 ug | Freq: Once | INTRAVENOUS | 0 refills | Status: CP
Start: 2019-04-27 — End: ?
  Administered 2019-04-28: 04:00:00 50 ug via INTRAVENOUS

## 2019-04-27 MED ORDER — IOHEXOL 350 MG IODINE/ML IV SOLN
100 mL | Freq: Once | INTRAVENOUS | 0 refills | Status: CP
Start: 2019-04-27 — End: ?
  Administered 2019-04-28: 05:00:00 100 mL via INTRAVENOUS

## 2019-04-27 MED ORDER — SODIUM CHLORIDE 0.9 % IJ SOLN
50 mL | Freq: Once | INTRAVENOUS | 0 refills | Status: CP
Start: 2019-04-27 — End: ?
  Administered 2019-04-28: 05:00:00 50 mL via INTRAVENOUS

## 2019-04-27 MED ORDER — LACTATED RINGERS IV SOLP
INTRAVENOUS | 0 refills | Status: DC
Start: 2019-04-27 — End: 2019-04-28

## 2019-04-27 MED ORDER — ONDANSETRON HCL (PF) 4 MG/2 ML IJ SOLN
4 mg | Freq: Once | INTRAVENOUS | 0 refills | Status: CP
Start: 2019-04-27 — End: ?
  Administered 2019-04-28: 04:00:00 4 mg via INTRAVENOUS

## 2019-04-27 MED ORDER — FENTANYL CITRATE (PF) 50 MCG/ML IJ SOLN
50 ug | Freq: Once | INTRAVENOUS | 0 refills | Status: DC
Start: 2019-04-27 — End: 2019-04-28

## 2019-04-27 MED ORDER — LACTATED RINGERS IV SOLP
1000 mL | INTRAVENOUS | 0 refills | Status: CP
Start: 2019-04-27 — End: ?
  Administered 2019-04-28: 04:00:00 1000 mL via INTRAVENOUS

## 2019-04-28 ENCOUNTER — Emergency Department: Admit: 2019-04-28 | Discharge: 2019-04-27 | Payer: BC Managed Care – PPO

## 2019-04-28 LAB — LIPASE: Lab: 26 U/L — ABNORMAL LOW (ref 11–82)

## 2019-04-28 LAB — COMPREHENSIVE METABOLIC PANEL
Lab: 0.3 mg/dL (ref 0.3–1.2)
Lab: 0.7 mg/dL (ref 0.4–1.00)
Lab: 103 MMOL/L — ABNORMAL LOW (ref 98–110)
Lab: 11 10*3/uL (ref 3–12)
Lab: 110 U/L — ABNORMAL LOW (ref 25–110)
Lab: 13 U/L (ref 7–56)
Lab: 139 MMOL/L (ref 137–147)
Lab: 14 mg/dL — ABNORMAL LOW (ref 7–25)
Lab: 143 mg/dL — ABNORMAL HIGH (ref 70–100)
Lab: 15 U/L (ref 7–40)
Lab: 25 MMOL/L (ref 21–30)
Lab: 4.3 g/dL (ref 3.5–5.0)
Lab: 7.2 g/dL (ref 6.0–8.0)
Lab: 9.4 mg/dL — ABNORMAL HIGH (ref 8.5–10.6)
Lab: >60 mL/min (ref >60)
Lab: >60 mL/min — ABNORMAL LOW (ref >60)

## 2019-04-28 LAB — URINALYSIS MICROSCOPIC REFLEX TO CULTURE

## 2019-04-28 LAB — CBC AND DIFF
Lab: 0 10*3/uL (ref 0–0.20)
Lab: 0.3 10*3/uL (ref 0–0.45)
Lab: 17 (ref <20.7)
Lab: 7.3 10*3/uL (ref 4.5–11.0)

## 2019-04-28 LAB — URINALYSIS DIPSTICK REFLEX TO CULTURE: Lab: NEGATIVE

## 2019-04-28 LAB — POC CREATININE, RAD: Lab: 0.6 mg/dL (ref 0.4–1.00)

## 2019-04-28 LAB — POC LACTATE: Lab: 1.7 MMOL/L (ref 0.5–2.0)

## 2019-04-28 MED ORDER — MORPHINE 2 MG/ML IV SYRG
4 mg | Freq: Once | INTRAVENOUS | 0 refills | Status: CP
Start: 2019-04-28 — End: ?
  Administered 2019-04-28: 06:00:00 4 mg via INTRAVENOUS

## 2019-04-28 NOTE — ED Notes
Patient arrives to bed 28 from triage for the evaluation of right side pain that radiates to RLQ that patient reports is different from her chronic UC and diverticulitis pain. Endorses n/v but denies diarrhea, vaginal discharge or urinary s/s. Patient asked to don gown, call light in reach and warm blankets given. Patient placed on continuous cardiac, BP, and SpO2 monitors. Protocols initiated and patient updated on plan of care with verbalization of understanding.           BELONGINGS:

## 2019-04-28 NOTE — ED Notes
Patient to CT via cart in stable condition.

## 2019-04-28 NOTE — ED Notes
Dr. Herrmann at bedside per MD evaluation.

## 2019-04-28 NOTE — ED Notes
Patient returns from CT in stable condition.

## 2019-04-28 NOTE — ED Notes
Patient resting quietly on ED cart with NAD.

## 2019-05-01 ENCOUNTER — Encounter: Admit: 2019-05-01 | Discharge: 2019-05-01 | Payer: BC Managed Care – PPO

## 2019-05-01 ENCOUNTER — Emergency Department: Admit: 2019-05-01 | Discharge: 2019-05-01 | Discharge status: Against Medical Advice | Payer: BC Managed Care – PPO

## 2019-05-01 DIAGNOSIS — D649 Anemia, unspecified: Secondary | ICD-10-CM

## 2019-05-01 DIAGNOSIS — K429 Umbilical hernia without obstruction or gangrene: Secondary | ICD-10-CM

## 2019-05-01 DIAGNOSIS — K760 Fatty (change of) liver, not elsewhere classified: Secondary | ICD-10-CM

## 2019-05-01 DIAGNOSIS — N823 Fistula of vagina to large intestine: Secondary | ICD-10-CM

## 2019-05-01 DIAGNOSIS — K219 Gastro-esophageal reflux disease without esophagitis: Secondary | ICD-10-CM

## 2019-05-01 DIAGNOSIS — E785 Hyperlipidemia, unspecified: Secondary | ICD-10-CM

## 2019-05-01 DIAGNOSIS — K626 Ulcer of anus and rectum: Secondary | ICD-10-CM

## 2019-05-01 DIAGNOSIS — R1031 Right lower quadrant pain: Secondary | ICD-10-CM

## 2019-05-01 DIAGNOSIS — F419 Anxiety disorder, unspecified: Secondary | ICD-10-CM

## 2019-05-01 DIAGNOSIS — K5730 Diverticulosis of large intestine without perforation or abscess without bleeding: Secondary | ICD-10-CM

## 2019-05-17 ENCOUNTER — Emergency Department: Admit: 2019-05-17 | Discharge: 2019-05-17 | Discharge status: Against Medical Advice | Payer: BC Managed Care – PPO

## 2019-05-17 ENCOUNTER — Encounter: Admit: 2019-05-17 | Discharge: 2019-05-17 | Payer: BC Managed Care – PPO

## 2019-05-17 DIAGNOSIS — D649 Anemia, unspecified: Secondary | ICD-10-CM

## 2019-05-17 DIAGNOSIS — E785 Hyperlipidemia, unspecified: Secondary | ICD-10-CM

## 2019-05-17 DIAGNOSIS — N823 Fistula of vagina to large intestine: Secondary | ICD-10-CM

## 2019-05-17 DIAGNOSIS — F419 Anxiety disorder, unspecified: Secondary | ICD-10-CM

## 2019-05-17 DIAGNOSIS — K760 Fatty (change of) liver, not elsewhere classified: Secondary | ICD-10-CM

## 2019-05-17 DIAGNOSIS — K5730 Diverticulosis of large intestine without perforation or abscess without bleeding: Secondary | ICD-10-CM

## 2019-05-17 DIAGNOSIS — K626 Ulcer of anus and rectum: Secondary | ICD-10-CM

## 2019-05-17 DIAGNOSIS — K219 Gastro-esophageal reflux disease without esophagitis: Secondary | ICD-10-CM

## 2019-05-17 DIAGNOSIS — K429 Umbilical hernia without obstruction or gangrene: Secondary | ICD-10-CM

## 2019-05-17 LAB — CBC AND DIFF
Lab: 0 % (ref >60)
Lab: 0.1 10*3/uL (ref 0–0.45)
Lab: 0.3 10*3/uL — ABNORMAL LOW (ref 1.0–4.8)
Lab: 25 pg — ABNORMAL LOW (ref 26–34)
Lab: 250 K/UL (ref 150–400)
Lab: 3 % (ref >60)
Lab: 3.9 10*3/uL (ref 1.8–7.0)
Lab: 30 % — ABNORMAL LOW (ref 36–45)
Lab: 32 g/dL (ref 32.0–36.0)
Lab: 4.9 10*3/uL (ref 4.5–11.0)
Lab: 7 % — ABNORMAL LOW (ref 24–44)
Lab: 78 FL — ABNORMAL LOW (ref 80–100)
Lab: 8 FL (ref 7–11)
Lab: 81 % — ABNORMAL HIGH (ref 41–77)
Lab: 9 % (ref 4–12)
Lab: 9.9 g/dL — ABNORMAL LOW (ref 12.0–15.0)
Lab: 19 (ref <20.7)

## 2019-05-17 LAB — LIPASE: Lab: 16 U/L — ABNORMAL LOW (ref 11–82)

## 2019-05-17 LAB — COMPREHENSIVE METABOLIC PANEL
Lab: 141 MMOL/L (ref 137–147)
Lab: 3.7 MMOL/L (ref 3.5–5.1)

## 2019-05-17 LAB — LACTIC ACID (BG - RAPID LACTATE): Lab: 1.5 MMOL/L (ref 0.5–2.0)

## 2019-05-17 MED ORDER — DICYCLOMINE 10 MG PO CAP
10 mg | Freq: Once | ORAL | 0 refills | Status: DC
Start: 2019-05-17 — End: 2019-05-17

## 2019-05-17 MED ORDER — HALOPERIDOL LACTATE 5 MG/ML IJ SOLN
2.5 mg | Freq: Once | INTRAVENOUS | 0 refills | Status: DC
Start: 2019-05-17 — End: 2019-05-17

## 2019-05-17 NOTE — ED Provider Notes
Tanya French is a 54 y.o. female.    Chief Complaint:  Chief Complaint   Patient presents with   ? Abdominal pain     N/V/D, LLQ pain, hx of diverticulitis       History of Present Illness:  Tanya French is a 54 y.o. female, with a history of GERD, HLD, rectal ulcer, sigmoid diverticulosis, and hepatic steatosis who presents to the emergency department for abdominal pain. Patient with complaint of abdominal pain starting last night around 2200. She states the abdominal pain is sharp, stabbing, left lower, with associated nausea and vomiting. She reports diarrhea since symptom onset; denies any blood in stool. She has a history of ulcerative colitis with last flare 1 month ago and diverticulitis. She reports taking zofran, suppository, hydrocodone, and applied heat with no relief noted. Pertinent surgical history includes hysterectomy, oophorectomy, appendectomy, and cholecystectomy.       History provided by:  Patient  Language interpreter used: No        Review of Systems:  Review of Systems   Constitutional: Negative for activity change, chills, fatigue and fever.   HENT: Negative for congestion.    Eyes: Negative for visual disturbance.   Respiratory: Negative for cough and shortness of breath.    Cardiovascular: Negative for chest pain.   Gastrointestinal: Positive for abdominal pain, diarrhea, nausea and vomiting. Negative for blood in stool.   Genitourinary: Negative for difficulty urinating.   Musculoskeletal: Negative for myalgias.   Skin: Negative for rash.   Allergic/Immunologic: Positive for food allergies.   Neurological: Negative for headaches.   Psychiatric/Behavioral: Negative for confusion.       Allergies:  Toradol [ketorolac], Keflex [cephalexin], and Compazine [prochlorperazine]    Past Medical History:  Medical History:   Diagnosis Date   ? Anemia    ? Anxiety    ? GERD (gastroesophageal reflux disease)    ? Hepatic steatosis    ? Hyperlipidemia    ? Periumbilical hernia ? Rectal ulcer    ? 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 Sigmoid diverticulosis        Past Surgical History:  Surgical History:   Procedure Laterality Date   ? ACL RECONSTRUCTION  2008   ? HYSTERECTOMY  2009    R oophorectomy   ? APPENDECTOMY  2009   ? CHOLECYSTECTOMY  2009   ? OOPHORECTOMY  2010    L oophorectomy   ? CARPAL TUNNEL RELEASE  2012   ? SIGMOIDOSCOPY DIAGNOSTIC N/A 03/20/2015    [facility]formed by [facility] ENDO   ? SIGMOIDOSCOPY BIOPSY  03/20/2015    [facility] by [facility] at [facility] ENDO   ? COLONOSCOPY N/A 08/05/2016    Performed by Tommie Sams, MD at [facility] ENDO   ? COLONOSCOPY BIOPSY  08/05/2016    Per[facility] by Tom[facility] at [facility] ENDO   ? PLANTAR FASCIA SURGERY         Pertinent medical/surgical history reviewed  Medical History:   Diagnosis Date   ? Anemia    ? Anxiety    ? GERD (gastroesophageal reflux disease)    ? Hepatic steatosis    ? Hyperlipidemia    ? Periumbilical hernia    ? Rectal ulcer    ? 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 Sigmoid diverticulosis      Surgical History:   Procedure Laterality Date   ?  ACL RECONSTRUCTION  2008   ? HYSTERECTOMY  2009    R oophorectomy   ? APPENDECTOMY  2009   ? CHOLECYSTECTOMY  2009   ? OOPHORECTOMY  2010    L oophorectomy   ? CARPAL TUNNEL RELEASE  2012   ? SIGMOIDOSCOPY DIAGNOSTIC N/A 03/20/2015    Performed by [facility]air, MD at [facility] ENDO   ? SIGMOIDOSCOPY BIOPSY  03/20/2015    Performed by [facility], [facility] at [facility] ENDO   ? COLONOSCOPY N/A 08/05/2016    Performed by [facility], [facility] at [facility] ENDO   ? COLONOSCOPY BIOPSY  08/05/2016    Performed by Tommie Sams, MD at [facility] ENDO   ? PLANTAR FASCIA SURGERY         Social History:  Social History     Tobacco Use   ? Smoking status: Former Smoker     Packs/day: 0.50     Years: 20.00     Pack years: 10.00     Types: Cigarettes   ? Smokeless tobacco: Never Used   Substance Use Topics   ? Alcohol use: Yes     Comment: social   ? Drug use: No Social History     Substance and Sexual Activity   Drug Use No       Family History:  Family History   Problem Relation Age of Onset   ? Cancer Maternal Grandmother         colon CA at age 9   ? Diabetes Father    ? Heart Attack Father    ? Hypertension Father    ? Other Father         arrhythmias   ? Hypertension Mother    ? Other Mother         COPD       Vitals:  ED Vitals    Date and Time T BP P RR SPO2P SPO2 User   05/17/19 1300 -- -- 94 21 PER MINUTE 94 96 % KT   05/17/19 1230 -- -- 96 22 PER MINUTE 96 96 % KT   05/17/19 1200 -- -- 101 18 PER MINUTE 98 98 % KT   05/17/19 1130 -- -- 99 24 PER MINUTE 99 94 % KT   05/17/19 1057 37.2 ?C (99 ?F) 151/101 -- -- 106 99 % OK          Physical Exam:  Physical Exam  Vitals signs and nursing note reviewed.   Constitutional:       Appearance: Normal appearance. She is well-developed and normal weight.   HENT:      Head: Normocephalic and atraumatic.      Right Ear: External ear normal.      Left Ear: External ear normal.   Eyes:      Extraocular Movements: Extraocular movements intact.      Conjunctiva/sclera: Conjunctivae normal.   Neck:      Musculoskeletal: Neck supple.   Cardiovascular:      Rate and Rhythm: Regular rhythm. Tachycardia present.      Pulses: Normal pulses.      Heart sounds: Normal heart sounds.   Pulmonary:      Effort: Pulmonary effort is normal. No respiratory distress.      Breath sounds: Normal breath sounds.   Abdominal:      General: Abdomen is flat. Bowel sounds are increased. There is no distension.      Palpations: Abdomen is soft.      Tenderness:  There is abdominal tenderness in the periumbilical area, left upper quadrant and left lower quadrant. There is guarding (LLQ).   Musculoskeletal: Normal range of motion.   Skin:     General: Skin is warm and dry.      Capillary Refill: Capillary refill takes less than 2 seconds.   Neurological:      General: No focal deficit present.      Mental Status: She is alert and oriented to person, place, and time. Mental status is at baseline.   Psychiatric:         Mood and Affect: Mood normal.         Behavior: Behavior normal.         Laboratory Results:  Labs Reviewed   COMPREHENSIVE METABOLIC PANEL - Abnormal       Result Value Ref Range Status    Sodium 141  137 - 147 MMOL/L Final    Potassium 3.7  3.5 - 5.1 MMOL/L Final    Chloride 105  98 - 110 MMOL/L Final    Glucose 114 (*) 70 - 100 MG/DL Final    Blood Urea Nitrogen 4 (*) 7 - 25 MG/DL Final    Creatinine 1.61  0.4 - 1.00 MG/DL Final    Calcium 8.6  8.5 - 10.6 MG/DL Final    Total Protein 5.8 (*) 6.0 - 8.0 G/DL Final    Total Bilirubin 0.4  0.3 - 1.2 MG/DL Final    Albumin 3.6  3.5 - 5.0 G/DL Final    Alk Phosphatase 93  25 - 110 U/L Final    AST (SGOT) 9  7 - 40 U/L Final    CO2 25  21 - 30 MMOL/L Final    ALT (SGPT) 11  7 - 56 U/L Final    Anion Gap 11  3 - 12 Final    eGFR Non African American >60  >60 mL/min Final    eGFR African American >60  >60 mL/min Final   CBC AND DIFF - Abnormal    White Blood Cells 4.9  4.5 - 11.0 K/UL Final    RBC 3.92 (*) 4.0 - 5.0 M/UL Final    Hemoglobin 9.9 (*) 12.0 - 15.0 GM/DL Final    Hematocrit 09.6 (*) 36 - 45 % Final    MCV 78.3 (*) 80 - 100 FL Final    MCH 25.3 (*) 26 - 34 PG Final    MCHC 32.3  32.0 - 36.0 G/DL Final    RDW 04.5 (*) 11 - 15 % Final    Platelet Count 250  150 - 400 K/UL Final    MPV 8.0  7 - 11 FL Final    Neutrophils 81 (*) 41 - 77 % Final    Lymphocytes 7 (*) 24 - 44 % Final    Monocytes 9  4 - 12 % Final    Eosinophils 3  0 - 5 % Final    Basophils 0  0 - 2 % Final    Absolute Neutrophil Count 3.98  1.8 - 7.0 K/UL Final    Absolute Lymph Count 0.36 (*) 1.0 - 4.8 K/UL Final    Absolute Monocyte Count 0.43  0 - 0.80 K/UL Final    Absolute Eosinophil Count 0.12  0 - 0.45 K/UL Final    Absolute Basophil Count 0.02  0 - 0.20 K/UL Final    MDW (Monocyte Distribution Width) 19.9  <20.7 Final   CULTURE-BLOOD W/SENSITIVITY   CULTURE-BLOOD W/SENSITIVITY  LACTIC ACID (BG - RAPID LACTATE)    Lactic Acid,BG 1.5  0.5 - 2.0 MMOL/L Final   LIPASE    Lipase 16  11 - 82 U/L Final   LACTIC ACID(LACTATE)   URINALYSIS DIPSTICK REFLEX TO CULTURE   URINALYSIS MICROSCOPIC REFLEX TO CULTURE   UA REFLEX LABEL   BLUE TOP TUBE          Radiology Interpretation:    POC ED US GUIDED VASCULAR ACCESS    (Results Pending)       EKG:      ED Course:  1:45pm  Patient presented tachycardic, review of chart demonstrates multiple visits presenting for the same thing with tachycardia, patient also has had multiple CT scans in the last year, we were demonstrates that she also had an ED visit last night for the exact same complaints with negative CAT scan results and lab work-up.  Patient was a difficult IV stick, she did require ultrasound-guided vascular access.  We did order Haldol as well as Bentyl given her allergies for nausea and pain control and she has refused these medications, became frustrated, and states that she is ready to leave. Options explored for alternative approaches including awaiting results of labs and reassessment for medication necessity. All attempts were made to encourage patient to stay to complete workup recommended, patient aware of risks of leaving including inability to eliminate life threatening or disfiguring or debilitating causes today and patient still expresses desires to leave, verbalizes understanding of risks, is deemed capable of making own medical decisions. Patient encouraged to return any moment for continued/completed workup. Patient appears to have a history of similar behaviors in the ED.       ED Scoring:                             MDM  Reviewed: nursing note, vitals and previous chart  Reviewed previous: labs and CT scan  Interpretation: labs        Facility Administered Meds:  Medications   haloperidol lactate (HALDOL) injection 2.5 mg (2.5 mg Intravenous Med Not Given 05/17/19 1338)   dicyclomine (BENTYL) capsule 10 mg (10 mg Oral Med Not Given 05/17/19 1338)         Clinical Impression: Clinical Impression   None       Disposition/Follow up  ED Disposition     ED Disposition    AMA        No follow-up provider specified.    Medications:  Discharge Medication List as of 05/17/2019  1:48 PM          Procedure Notes:  Procedures      Attestation / Supervision:  Jeronimo Norma, am scribing for and in the presence of Brice, [place].    Daryel November    Attestation / Supervision Note concerning Merleen Milliner Timbrook: I personally performed the E/M including history, physical exam, and MDM. and I, Tae Vonada Michail Jewels, PA-C, personally performed the services described in this documentation as scribed in my presence and it is both accurate and complete.    Lindwood Qua, PA-C

## 2019-05-17 NOTE — ED Notes
Patient was offered medications ordered by B.Whigham [place]. She began crying stating that she does not want the medications. She states she does not like how haldol makes her feel and she takes bentyl "all day long". Crying and agitated regarding medications ordered. B.Hall PA notified.

## 2019-05-17 NOTE — ED Notes
Patient became upset after discussion with this RN physician regarding medications. She decided to leave against medical advice. Risks were explained to her. She refused to sign the AMA form and verbally stated "I am not going to sign anything anyways." She left the department, ambulatory with a steady gait, alert and oriented. PIV was removed prior to her leaving.

## 2019-05-28 ENCOUNTER — Observation Stay: Admit: 2019-05-28 | Discharge: 2019-05-28 | Payer: BC Managed Care – PPO

## 2019-05-28 ENCOUNTER — Encounter: Admit: 2019-05-28 | Discharge: 2019-05-28 | Payer: BC Managed Care – PPO

## 2019-05-28 DIAGNOSIS — R109 Unspecified abdominal pain: Secondary | ICD-10-CM

## 2019-05-28 DIAGNOSIS — S3991XA Unspecified injury of abdomen, initial encounter: Secondary | ICD-10-CM

## 2019-05-28 MED ORDER — DOCUSATE SODIUM 100 MG PO CAP
100 mg | Freq: Two times a day (BID) | ORAL | 0 refills | Status: DC
Start: 2019-05-28 — End: 2019-05-29

## 2019-05-28 MED ORDER — METOPROLOL SUCCINATE 50 MG PO TB24
25 mg | Freq: Every day | ORAL | 0 refills | Status: DC
Start: 2019-05-28 — End: 2019-05-29

## 2019-05-28 MED ORDER — ACETAMINOPHEN 325 MG PO TAB
650 mg | ORAL | 0 refills | Status: DC | PRN
Start: 2019-05-28 — End: 2019-05-29
  Administered 2019-05-29: 08:00:00 650 mg via ORAL

## 2019-05-28 MED ORDER — POLYETHYLENE GLYCOL 3350 17 GRAM PO PWPK
17 g | Freq: Every day | ORAL | 0 refills | Status: DC
Start: 2019-05-28 — End: 2019-05-29

## 2019-05-28 MED ORDER — OXYCODONE 5 MG PO TAB
5-10 mg | ORAL | 0 refills | Status: DC | PRN
Start: 2019-05-28 — End: 2019-05-29
  Administered 2019-05-29: 06:00:00 10 mg via ORAL

## 2019-05-28 MED ORDER — OXYCODONE 5 MG PO TAB
5-15 mg | ORAL | 0 refills | Status: DC | PRN
Start: 2019-05-28 — End: 2019-05-29

## 2019-05-28 MED ORDER — PANTOPRAZOLE 40 MG PO TBEC
40 mg | Freq: Every day | ORAL | 0 refills | Status: DC
Start: 2019-05-28 — End: 2019-05-29

## 2019-05-28 MED ORDER — ONDANSETRON HCL (PF) 4 MG/2 ML IJ SOLN
4 mg | Freq: Once | INTRAVENOUS | 0 refills | Status: CP
Start: 2019-05-28 — End: ?
  Administered 2019-05-29: 01:00:00 4 mg via INTRAVENOUS

## 2019-05-28 MED ORDER — FENTANYL CITRATE (PF) 50 MCG/ML IJ SOLN
25 ug | Freq: Once | INTRAVENOUS | 0 refills | Status: CP
Start: 2019-05-28 — End: ?
  Administered 2019-05-29: 04:00:00 25 ug via INTRAVENOUS

## 2019-05-28 MED ORDER — SODIUM CHLORIDE 0.9 % IJ SOLN
50 mL | Freq: Once | INTRAVENOUS | 0 refills | Status: CP
Start: 2019-05-28 — End: ?
  Administered 2019-05-29: 02:00:00 50 mL via INTRAVENOUS

## 2019-05-28 MED ORDER — ONDANSETRON HCL 4 MG PO TAB
4 mg | ORAL | 0 refills | Status: DC | PRN
Start: 2019-05-28 — End: 2019-05-29
  Administered 2019-05-29: 06:00:00 4 mg via ORAL

## 2019-05-28 MED ORDER — IOHEXOL 350 MG IODINE/ML IV SOLN
100 mL | Freq: Once | INTRAVENOUS | 0 refills | Status: DC
Start: 2019-05-28 — End: 2019-06-02

## 2019-05-28 MED ORDER — IOPAMIDOL 76 % IV SOLN
100 mL | Freq: Once | INTRAVENOUS | 0 refills | Status: CP
Start: 2019-05-28 — End: ?
  Administered 2019-05-29: 02:00:00 100 mL via INTRAVENOUS

## 2019-05-28 MED ORDER — METHOCARBAMOL 750 MG PO TAB
750 mg | Freq: Two times a day (BID) | ORAL | 0 refills | Status: DC
Start: 2019-05-28 — End: 2019-05-29
  Administered 2019-05-29: 06:00:00 750 mg via ORAL

## 2019-05-28 MED ORDER — SIMVASTATIN 40 MG PO TAB
40 mg | Freq: Every evening | ORAL | 0 refills | Status: DC
Start: 2019-05-28 — End: 2019-05-29

## 2019-05-28 MED ORDER — DULOXETINE 60 MG PO CPDR
120 mg | Freq: Every evening | ORAL | 0 refills | Status: DC
Start: 2019-05-28 — End: 2019-05-29

## 2019-05-28 MED ORDER — HYDROCODONE-ACETAMINOPHEN 5-325 MG PO TAB
1 | Freq: Once | ORAL | 0 refills | Status: CP
Start: 2019-05-28 — End: ?
  Administered 2019-05-29: 01:00:00 1 via ORAL

## 2019-05-28 MED ORDER — FAMOTIDINE (PF) 20 MG/2 ML IV SOLN
20 mg | Freq: Two times a day (BID) | INTRAVENOUS | 0 refills | Status: DC
Start: 2019-05-28 — End: 2019-05-29

## 2019-05-28 MED ORDER — ENOXAPARIN 30 MG/0.3 ML SC SYRG
30 mg | Freq: Two times a day (BID) | SUBCUTANEOUS | 0 refills | Status: DC
Start: 2019-05-28 — End: 2019-05-29

## 2019-05-29 ENCOUNTER — Emergency Department: Admit: 2019-05-29 | Discharge: 2019-05-28 | Payer: BC Managed Care – PPO

## 2019-05-29 LAB — CBC AND DIFF
Lab: 10 g/dL — ABNORMAL LOW (ref 12.0–15.0)
Lab: 20 % — ABNORMAL HIGH (ref 11–15)
Lab: 26 pg (ref 26–34)
Lab: 291 K/UL — ABNORMAL HIGH (ref 150–400)
Lab: 3.6 K/UL — ABNORMAL LOW (ref 4.5–11.0)
Lab: 32 % — ABNORMAL LOW (ref 36–45)
Lab: 33 g/dL (ref 32.0–36.0)
Lab: 4.1 M/UL — ABNORMAL HIGH (ref 4.0–5.0)
Lab: 77 FL — ABNORMAL LOW (ref 80–100)
Lab: 8 % — ABNORMAL LOW (ref 24–44)
Lab: 83 % — ABNORMAL HIGH (ref 41–77)

## 2019-05-29 LAB — COMPREHENSIVE METABOLIC PANEL
Lab: 140 MMOL/L (ref 137–147)
Lab: 3.5 MMOL/L (ref 3.5–5.1)

## 2019-05-29 LAB — URINALYSIS DIPSTICK REFLEX TO CULTURE
Lab: NEGATIVE
Lab: NEGATIVE
Lab: NEGATIVE
Lab: NEGATIVE

## 2019-05-29 LAB — URINALYSIS MICROSCOPIC REFLEX TO CULTURE

## 2019-05-29 LAB — POC TROPONIN: Lab: 0 ng/mL (ref 0.00–0.05)

## 2019-05-29 LAB — POC CREATININE, RAD: Lab: 0.5 mg/dL (ref 0.4–1.00)

## 2019-05-29 LAB — LIPASE: Lab: 31 U/L (ref 11–82)

## 2019-05-29 MED ORDER — OXYCODONE 5 MG PO TAB
5 mg | ORAL | 0 refills | Status: DC | PRN
Start: 2019-05-29 — End: 2019-05-29

## 2019-05-29 MED ORDER — TRAMADOL 50 MG PO TAB
50 mg | Freq: Once | ORAL | 0 refills | Status: CP
Start: 2019-05-29 — End: ?
  Administered 2019-05-29: 13:00:00 50 mg via ORAL

## 2019-05-29 MED ORDER — ACETAMINOPHEN 500 MG PO TAB
1000 mg | ORAL | 0 refills | Status: DC
Start: 2019-05-29 — End: 2019-05-29

## 2019-05-29 MED ORDER — LIDOCAINE 5 % TP PTMD
1 | Freq: Once | TOPICAL | 0 refills | Status: DC
Start: 2019-05-29 — End: 2019-05-29
  Administered 2019-05-29: 08:00:00 1 via TOPICAL

## 2019-05-29 MED ORDER — MORPHINE 4 MG/ML IV SYRG
10 mg | Freq: Once | INTRAVENOUS | 0 refills | Status: CP
Start: 2019-05-29 — End: ?
  Administered 2019-05-29: 08:00:00 10 mg via INTRAVENOUS
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
## 2019-06-07 ENCOUNTER — Encounter: Admit: 2019-06-07 | Discharge: 2019-06-07 | Payer: BC Managed Care – PPO

## 2019-06-07 DIAGNOSIS — K219 Gastro-esophageal reflux disease without esophagitis: Secondary | ICD-10-CM

## 2019-06-07 DIAGNOSIS — F419 Anxiety disorder, unspecified: Secondary | ICD-10-CM

## 2019-06-07 DIAGNOSIS — K760 Fatty (change of) liver, not elsewhere classified: Secondary | ICD-10-CM

## 2019-06-07 DIAGNOSIS — N823 Fistula of vagina to large intestine: Secondary | ICD-10-CM

## 2019-06-07 DIAGNOSIS — K5730 Diverticulosis of large intestine without perforation or abscess without bleeding: Secondary | ICD-10-CM

## 2019-06-07 DIAGNOSIS — K429 Umbilical hernia without obstruction or gangrene: Secondary | ICD-10-CM

## 2019-06-07 DIAGNOSIS — E785 Hyperlipidemia, unspecified: Secondary | ICD-10-CM

## 2019-06-07 DIAGNOSIS — M5442 Lumbago with sciatica, left side: Secondary | ICD-10-CM

## 2019-06-07 DIAGNOSIS — K626 Ulcer of anus and rectum: Secondary | ICD-10-CM

## 2019-06-07 DIAGNOSIS — D649 Anemia, unspecified: Secondary | ICD-10-CM

## 2019-06-08 ENCOUNTER — Emergency Department
Admit: 2019-06-08 | Discharge: 2019-06-08 | Disposition: A | Discharge status: Home / Self Care | Payer: BC Managed Care – PPO

## 2019-06-08 NOTE — ED Triage Notes
Saw PCP for her back pain. Pt reports that she received x-rays at PCP office, however, they came back "norma." Pt reports that she was given oxy and soma for her pain, however, pain continues to persist.

## 2019-06-08 NOTE — ED Notes
Pt stated she did not want to wait for discharge paperwork and insisted on leaving. Pt ambulating with steady gait and without distress.

## 2019-07-03 ENCOUNTER — Emergency Department

## 2019-07-03 MED ORDER — CYCLOBENZAPRINE 10 MG PO TAB
10 mg | Freq: Once | ORAL | 0 refills | Status: DC
Start: 2019-07-03 — End: 2019-07-03

## 2019-07-03 MED ORDER — LIDOCAINE 5 % TP PTMD
1 | Freq: Once | TOPICAL | 0 refills | Status: DC
Start: 2019-07-03 — End: 2019-07-03

## 2019-07-13 ENCOUNTER — Encounter: Admit: 2019-07-13 | Discharge: 2019-07-13 | Payer: BC Managed Care – PPO

## 2019-07-13 ENCOUNTER — Emergency Department: Admit: 2019-07-13 | Discharge: 2019-07-13 | Discharge status: Against Medical Advice | Payer: BC Managed Care – PPO

## 2019-07-13 MED ORDER — ACETAMINOPHEN 325 MG PO TAB
650 mg | Freq: Once | ORAL | 0 refills | Status: DC
Start: 2019-07-13 — End: 2019-07-13

## 2019-07-13 MED ORDER — ONDANSETRON HCL (PF) 4 MG/2 ML IJ SOLN
4 mg | Freq: Once | INTRAVENOUS | 0 refills | Status: DC
Start: 2019-07-13 — End: 2019-07-13

## 2019-07-13 MED ORDER — LACTATED RINGERS IV SOLP
1000 mL | INTRAVENOUS | 0 refills | Status: DC
Start: 2019-07-13 — End: 2019-07-13

## 2019-07-13 NOTE — ED Notes
Pt. Not in room or bathroom, gown on bed and belongings gone.

## 2019-07-13 NOTE — ED Notes
Pt. To ED POV, ambulatory to room 9. Pt. States son brought her today, he is not currently present. Pt. Reports have abdominal pain, n/v/d since Wed. Last diarrhea was 3 hrs ago, took home Zofran 4mg  at 11; pt. States her dad tested + for Covid and she was around him earlier this week. Also, nasal congestion and productive cough for yellow phlegm. Just reports overall not feeling well since wed.     Medical History:   Diagnosis Date   ? Anemia    ? Anxiety    ? GERD (gastroesophageal reflux disease)    ? Hepatic steatosis    ? Hyperlipidemia    ? Periumbilical hernia    ? Rectal ulcer    ? 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 Sigmoid diverticulosis      Belongings: t-shirt, bra, leggings, flip flps, purse, cell phone and charge, wallet: credit card and ID. Necklace with cross pendant. , ring on right hand with white stones- 3 rings sawdered together.

## 2019-09-03 ENCOUNTER — Encounter: Admit: 2019-09-03 | Discharge: 2019-09-03 | Payer: BC Managed Care – PPO

## 2019-09-03 ENCOUNTER — Emergency Department: Admit: 2019-09-03 | Discharge: 2019-09-03 | Discharge status: Against Medical Advice | Payer: BC Managed Care – PPO

## 2019-09-03 NOTE — ED Notes
Pt not seen nor responding when called upon for triaging x2 by Amil Amen, tech. PT LWBS.

## 2019-09-03 NOTE — ED Notes
Pt not responding nor seen when called upon for triage.

## 2020-01-06 ENCOUNTER — Emergency Department: Admit: 2020-01-06 | Discharge: 2020-01-06 | Payer: BC Managed Care – PPO

## 2020-01-06 ENCOUNTER — Encounter: Admit: 2020-01-06 | Discharge: 2020-01-06 | Payer: BC Managed Care – PPO

## 2020-01-06 DIAGNOSIS — S161XXA Strain of muscle, fascia and tendon at neck level, initial encounter: Secondary | ICD-10-CM

## 2020-01-06 DIAGNOSIS — W19XXXA Unspecified fall, initial encounter: Secondary | ICD-10-CM

## 2020-01-06 DIAGNOSIS — S20229A Contusion of unspecified back wall of thorax, initial encounter: Secondary | ICD-10-CM

## 2020-01-06 MED ORDER — ONDANSETRON HCL (PF) 4 MG/2 ML IJ SOLN
4 mg | INTRAVENOUS | 0 refills | Status: DC | PRN
Start: 2020-01-06 — End: 2020-01-06
  Administered 2020-01-06: 19:00:00 4 mg via INTRAVENOUS

## 2020-01-06 MED ORDER — OXYCODONE 5 MG PO TAB
5 mg | ORAL_TABLET | ORAL | 0 refills | 6.00000 days | Status: AC | PRN
Start: 2020-01-06 — End: ?

## 2020-01-06 MED ORDER — METHOCARBAMOL 750 MG PO TAB
750 mg | ORAL_TABLET | Freq: Three times a day (TID) | ORAL | 0 refills | Status: AC | PRN
Start: 2020-01-06 — End: ?

## 2020-01-06 MED ORDER — OXYCODONE 10 MG PO TAB
10 mg | Freq: Once | ORAL | 0 refills | Status: CP
Start: 2020-01-06 — End: ?
  Administered 2020-01-06: 21:00:00 10 mg via ORAL

## 2020-01-06 MED ORDER — FENTANYL CITRATE (PF) 50 MCG/ML IJ SOLN
25-50 ug | INTRAVENOUS | 0 refills | Status: CP | PRN
Start: 2020-01-06 — End: ?
  Administered 2020-01-06 (×2): 50 ug via INTRAVENOUS

## 2020-01-06 NOTE — ED Notes
Patient arrived to the ED via POV.  Complains of head, neck, and back pain after a fall.  Patient reports she fell backwards off a ladder from a height of 3-4 feet onto a table.  Hitting her head and back.  Denies loss of consciousness, alert and oriented, placed in a c-collar in triage.  Moves all extremities equally and spontaneously, no neurovascular deficit noted.  Reports nausea, no vomiting, no SOA.    Medical History:   Diagnosis Date   ? Anemia    ? Anxiety    ? GERD (gastroesophageal reflux disease)    ? Hepatic steatosis    ? Hyperlipidemia    ? Periumbilical hernia    ? Rectal ulcer    ? 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 Sigmoid diverticulosis        Belongings, jacket, shirt, pants, shoes, phone.

## 2020-01-06 NOTE — ED Notes
Patient left prior to discharge.  IV removed prior to leaving and bleeding controlled.

## 2020-01-06 NOTE — ED Provider Notes
Tanya French is a 54 y.o. female.    Chief Complaint:  Chief Complaint   Patient presents with   ? Fall     fell from ladder at 830am, fell on a wooden table hit back first, no thinners, no LOC, able to get up without help has back and neck pain        History of Present Illness:  Pt is a 54 y/o female who presents to the ED with complaints of falling off of a ladder. Pt reports she was standing on a step stool changing a light bulb when she lost her footing and fall approximately 3 feet landing on the kitchen table. Denies hitting her head. Denies LOC. Reports she landed on her mid back. She was able to get up by herself. She continued to have pain despite Tylenol and ice. Pt is also complaining of nausea unrelieved by Zofran that she took at home. She was brought into the ED by her son to be evaluated for neck and back pain. She denies numbness and tingling. Strength is intact. Denies use of blood thinners.       History provided by:  Patient  Language interpreter used: No    Fall  This is a new problem. The current episode started 3 to 5 hours ago. The problem has not changed since onset.Pertinent negatives include no chest pain, no abdominal pain, no headaches and no shortness of breath. The symptoms are aggravated by exertion and bending. Nothing relieves the symptoms. She has tried acetaminophen for the symptoms. The treatment provided no relief.       Review of Systems:  Review of Systems   Constitutional: Positive for activity change.   HENT: Negative.    Respiratory: Negative for shortness of breath.    Cardiovascular: Negative for chest pain.   Gastrointestinal: Negative for abdominal pain.   Musculoskeletal: Positive for back pain and neck pain.   Skin: Negative.    Neurological: Negative for weakness and headaches.   Hematological: Negative.    Psychiatric/Behavioral: Negative.        Allergies:  Toradol [ketorolac], Keflex [cephalexin], and Compazine [prochlorperazine]    Past Medical History:  Medical History:   Diagnosis Date   ? Anemia    ? Anxiety    ? GERD (gastroesophageal reflux disease)    ? Hepatic steatosis    ? Hyperlipidemia    ? Periumbilical hernia    ? Rectal ulcer    ? 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 Sigmoid diverticulosis        Past Surgical History:  Surgical History:   Procedure Laterality Date   ? ACL RECONSTRUCTION  2008   ? HYSTERECTOMY  2009    R oophorectomy   ? APPENDECTOMY  2009   ? CHOLECYSTECTOMY  2009   ? OOPHORECTOMY  2010    L oophorectomy   ? CARPAL TUNNEL RELEASE  2012   ? SIGMOIDOSCOPY DIAGNOSTIC N/A 03/20/2015    Performed by Tim Lair[facility] at [facility] ENDO   ? SIGMOIDOSCOPY BIOPSY  03/20/2015    Performed by Tim [facility]air, [facility] at [facility] ENDO   ? COLONOSCOPY N/A 08/05/2016    Performed by Tommie Sams, MD at [facility] ENDO   ? COLONOSCOPY BIOPSY  08/05/2016    [facility] by [facility] at [facility] ENDO   ? PLANTAR FASCIA SURGERY         Pertinent medical/surgical history reviewed  Medical History:  Diagnosis Date   ? Anemia    ? Anxiety    ? GERD (gastroesophageal reflux disease)    ? Hepatic steatosis    ? Hyperlipidemia    ? Periumbilical hernia    ? Rectal ulcer    ? 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 Sigmoid diverticulosis      Surgical History:   Procedure Laterality Date   ? ACL RECONSTRUCTION  2008   ? HYSTERECTOMY  2009    R oophorectomy   ? APPENDECTOMY  2009   ? CHOLECYSTECTOMY  2009   ? OOPHORECTOMY  2010    L oophorectomy   ? CARPAL TUNNEL RELEASE  2012   ? SIGMOIDOSCOPY DIAGNOSTIC N/A 03/20/2015    Performed by Tim Lair, MD at [facility] ENDO   ? SIGMOIDOSCOPY BIOPSY  03/20/2015    Performed by Tim Lair, MD at [facility] ENDO   ? COLONOSCOPY N/A 08/05/2016    Performed by Tommie Sams, MD at [facility] ENDO   ? COLONOSCOPY BIOPSY  08/05/2016    Performed by Tommie [facility], [facility] at [facility] ENDO   ? PLANTAR FASCIA SURGERY         Social History:  Social History     Tobacco Use   ? Smoking status: Former Smoker     Packs/day: 0.50 Years: 20.00     Pack years: 10.00     Types: Cigarettes   ? Smokeless tobacco: Never Used   Vaping Use   ? Vaping Use: Never used   Substance Use Topics   ? Alcohol use: Yes     Comment: social   ? Drug use: No     Social History     Substance and Sexual Activity   Drug Use No             Family History:  Family History   Problem Relation Age of Onset   ? Cancer Maternal Grandmother         colon CA at age 80   ? Diabetes Father    ? Heart Attack Father    ? Hypertension Father    ? Other Father         arrhythmias   ? Hypertension Mother    ? Other Mother         COPD       Vitals:  ED Vitals    Date and Time T BP P RR SPO2P SPO2 User   01/06/20 1500 -- -- -- -- 94 100 % BF   01/06/20 1400 -- 123/90 -- -- 95 98 % BF   01/06/20 1330 -- 117/97 -- -- 85 94 % BF   01/06/20 1248 37.2 ?C (98.9 ?F) 135/104 -- 22 PER MINUTE 125 -- TC          Physical Exam:  Physical Exam  Constitutional:       General: She is not in acute distress.  HENT:      Head: Normocephalic.      Right Ear: Tympanic membrane normal.      Left Ear: Tympanic membrane normal.      Nose: Nose normal.   Eyes:      Extraocular Movements: Extraocular movements intact.      Pupils: Pupils are equal, round, and reactive to light.   Cardiovascular:      Rate and Rhythm: Normal rate and regular rhythm.      Pulses: Normal pulses.   Pulmonary:  Effort: Pulmonary effort is normal.      Breath sounds: Normal breath sounds.   Abdominal:      General: Bowel sounds are normal.      Palpations: Abdomen is soft.   Musculoskeletal:         General: Tenderness present.      Cervical back: Tenderness present. Pain with movement, spinous process tenderness and muscular tenderness present. Decreased range of motion.      Right lower leg: No edema.      Left lower leg: No edema.   Skin:     Findings: No bruising or erythema.   Neurological:      General: No focal deficit present.      Mental Status: She is alert and oriented to person, place, and time.      Cranial Nerves: No cranial nerve deficit.      Sensory: Sensation is intact. No sensory deficit.      Motor: Motor function is intact. No weakness.      Coordination: Coordination is intact.   Psychiatric:         Mood and Affect: Mood normal.         Laboratory Results:  Labs Reviewed - No data to display       Radiology Interpretation:    CT SPINE CERVICAL WO CONTRAST   Final Result         1.  No evidence of acute cervical fracture or subluxation.      2.  Multilevel degenerative disc disease with up to mild neural foraminal narrowing on the left at C4-C5.                By my electronic signature, I attest that I have personally reviewed the images for this examination and formulated the interpretations and opinions expressed in this report          Finalized by Shanna Cisco, M.D. on 01/06/2020 3:49 PM. Dictated by Francee Piccolo, M.D. on 01/06/2020 3:07 PM.         T SPINE AP & LAT 2 VIEWS   Final Result   FINDINGS/IMPRESSION:      T-spine:      1.  Limited evaluation on lateral view due to patient's body habitus. No acute fracture or traumatic subluxation identified.   2.  Normal thoracic alignment. Vertebral body heights are maintained.   3.  Mild multilevel degenerative disc disease.   4.  Sternal fixation hardware overlies the thoracic vertebra seen on the frontal view.   5.  Diffuse osseous demineralization.      L-spine:      1.  No acute fracture or traumatic subluxation.   2.  Normal lumbar alignment.    3.  Prior L1 compression fracture with vertebroplasty cement.   4.  Moderate degenerative disc disease at T12-L1.      By my electronic signature, I attest that I have personally reviewed the images for this examination and formulated the interpretations and opinions expressed in this report          Finalized by Shanna Cisco, M.D. on 01/06/2020 3:44 PM. Dictated by Clide Dales, MD on 01/06/2020 2:55 PM.         L SPINE AP & LATERAL   Final Result   FINDINGS/IMPRESSION:      T-spine:      1.  Limited evaluation on lateral view due to patient's body habitus. No acute fracture or traumatic subluxation identified.   2.  Normal thoracic alignment. Vertebral body heights are maintained.   3.  Mild multilevel degenerative disc disease.   4.  Sternal fixation hardware overlies the thoracic vertebra seen on the frontal view.   5.  Diffuse osseous demineralization.      L-spine:      1.  No acute fracture or traumatic subluxation.   2.  Normal lumbar alignment.    3.  Prior L1 compression fracture with vertebroplasty cement.   4.  Moderate degenerative disc disease at T12-L1.      By my electronic signature, I attest that I have personally reviewed the images for this examination and formulated the interpretations and opinions expressed in this report          Finalized by Shanna Cisco, M.D. on 01/06/2020 3:44 PM. Dictated by Clide Dales, MD on 01/06/2020 2:55 PM.               EKG:        ED Course:  Patient seen in ED for a fall that happened prior to arrival.  Patient with extreme pain in her neck and back.  Patient was placed in a c-collar when she arrived to the ED.    We will get imaging of her neck and back, CT of her C-spine and a x-rays of her T and L-spine to rule out any acute fractures.  Patient does not have any neuro deficits on exam.  Patient was given some IV pain medications while waiting.  Patient reports that the IV fentanyl does not help her.  She did also have an IV that blew and has very difficult veins.  Discussed that I would offer her something orally for pain.  Patient became very tearful and states that nothing was going to help her pain.     Patient CT scan and x-rays showed no acute fracture or traumatic subluxation.     Patient was given oxycodone by mouth and sent home with oxycodone and some Robaxin.  Patient was able to ambulate to the bathroom without difficulty.  Patient is stable for discharge home and agrees with discharge plan.       ED Scoring:                             Coding    Facility Administered Meds:  Medications   ondansetron [facility]) injection 4 mg (4 mg Intravenous Given 01/06/20 1356)   fentaNYL citrate PF (SUBLIMAZE) injection 25-50 mcg (50 mcg Intravenous Given 01/06/20 1541)   oxyCODONE tablet 10 mg (10 mg Oral Given 01/06/20 1608)         Clinical Impression:  Clinical Impression   Fall, initial encounter   Contusion of back, unspecified laterality, initial encounter   Neck strain, initial encounter       Disposition/Follow up  ED Disposition     ED Disposition    Discharge        Family Medicine: [facility]  [address]  [address]  [place] [address]  [phone]  Schedule an appointment as soon as possible for a visit in 1 week  As needed----ice and heat as tolerated. Pain meds as needed. Follow up for continued pain.      Medications:  Discharge Medication List as of 01/06/2020  4:16 PM      START taking these medications    Details   methocarbamoL (ROBAXIN) 750 mg tablet Take  one tablet by mouth three times daily as needed for Spasms., Disp-15 tablet, R-0, Normal      oxyCODONE (ROXICODONE) 5 mg tablet Take one tablet by mouth every 6 hours as needed for Pain, Disp-12 tablet, R-0, Normal             Procedure Notes:  Procedures        Attestation / Supervision:  The service was provided by the APP alone with immediate availability of a physician in the ED.      Jennye Boroughs, APRN-NP

## 2020-01-14 ENCOUNTER — Emergency Department: Admit: 2020-01-14 | Discharge: 2020-01-15 | Payer: BC Managed Care – PPO

## 2020-01-14 ENCOUNTER — Encounter: Admit: 2020-01-14 | Discharge: 2020-01-14 | Payer: BC Managed Care – PPO

## 2020-01-14 ENCOUNTER — Emergency Department: Admit: 2020-01-14 | Discharge: 2020-01-14 | Payer: BC Managed Care – PPO

## 2020-01-14 DIAGNOSIS — R1030 Lower abdominal pain, unspecified: Secondary | ICD-10-CM

## 2020-01-14 DIAGNOSIS — K625 Hemorrhage of anus and rectum: Secondary | ICD-10-CM

## 2020-01-14 MED ORDER — LACTATED RINGERS IV SOLP
1000 mL | INTRAVENOUS | 0 refills | Status: CP
Start: 2020-01-14 — End: ?
  Administered 2020-01-15: 1000 mL via INTRAVENOUS

## 2020-01-14 MED ORDER — IOHEXOL 350 MG IODINE/ML IV SOLN
100 mL | Freq: Once | INTRAVENOUS | 0 refills | Status: CP
Start: 2020-01-14 — End: ?
  Administered 2020-01-15: 02:00:00 100 mL via INTRAVENOUS

## 2020-01-14 MED ORDER — SODIUM CHLORIDE 0.9 % IJ SOLN
50 mL | Freq: Once | INTRAVENOUS | 0 refills | Status: CP
Start: 2020-01-14 — End: ?
  Administered 2020-01-15: 02:00:00 50 mL via INTRAVENOUS

## 2020-01-14 MED ORDER — ONDANSETRON HCL (PF) 4 MG/2 ML IJ SOLN
4 mg | Freq: Once | INTRAVENOUS | 0 refills | Status: CP
Start: 2020-01-14 — End: ?
  Administered 2020-01-15: 4 mg via INTRAVENOUS

## 2020-01-14 MED ORDER — MORPHINE 4 MG/ML IV SYRG
6 mg | Freq: Once | INTRAVENOUS | 0 refills | Status: CP
Start: 2020-01-14 — End: ?
  Administered 2020-01-15: 6 mg via INTRAVENOUS

## 2020-01-14 NOTE — ED Notes
Maui Ahart is a 54 y/o female presenting to ED03 via EMS with CC of abdominal pain. Pt reports 9/10 sharp stabbing LLQ abdominal pain since last night. Pt also noticed bright red rectal bleeding last night as well that is present with and without BMs; pt denies hx of hemorrhoids but reports hx of diverticulitis and colitis. Pt endorses n/v/d as well with her last episode of vomiting being 1400 today. Pt took hydrocodone at 1500 today, zofran at 1400 and then was given zofran by EMS en route to ED around 1600. Pt denies fevers, chills, SOA, or CP. Pt is A&O; VSS; breathing is even and non labored on RA; skin is w/d/i. Awaiting provider eval.     Personal belongings: 1 pant, 1 shirt, 2 shoes, 1 purse with misc items, 1 cell phone, 1 cell phone charger

## 2020-01-15 LAB — COMPREHENSIVE METABOLIC PANEL
Lab: 0.6 mg/dL (ref 0.4–1.00)
Lab: 10 mg/dL (ref 7–25)
Lab: 107 MMOL/L — ABNORMAL LOW (ref 98–110)
Lab: 132 mg/dL — ABNORMAL HIGH (ref 70–100)
Lab: 141 MMOL/L — ABNORMAL LOW (ref 137–147)
Lab: 15 U/L (ref 7–40)
Lab: 25 MMOL/L (ref 21–30)
Lab: 3.9 MMOL/L — ABNORMAL LOW (ref 3.5–5.1)
Lab: 8 U/L (ref 7–56)
Lab: 8.3 mg/dL — ABNORMAL LOW (ref 8.5–10.6)
Lab: 88 U/L — ABNORMAL LOW (ref 25–110)
Lab: 9 K/UL (ref 3–12)
Lab: >60 mL/min (ref >60)
Lab: >60 mL/min — ABNORMAL LOW (ref >60)

## 2020-01-15 LAB — CBC AND DIFF
Lab: 0 10*3/uL (ref 0–0.20)
Lab: 0.1 10*3/uL (ref 0–0.45)
Lab: 17 (ref <20.7)
Lab: 3.2 10*3/uL — ABNORMAL LOW (ref 4.5–11.0)

## 2020-01-15 LAB — POC LACTATE: Lab: 1.2 MMOL/L (ref 0.5–2.0)

## 2020-01-15 LAB — LIPASE: Lab: 21 U/L (ref 11–82)

## 2020-01-15 LAB — C REACTIVE PROTEIN (CRP): Lab: 0.1 mg/dL — ABNORMAL LOW (ref <1.0)

## 2020-01-15 LAB — SED RATE: Lab: 20 mm/h (ref 0–30)

## 2020-01-15 LAB — POC CREATININE, RAD: Lab: 0.6 mg/dL (ref 0.4–1.00)

## 2020-01-15 NOTE — ED Notes
Discharge instructions and follow up instructions discussed with patient. Pt verbalizes understanding; pt denies any questions or concerns at this time. PIV removed. Pt ambulated with steady gait out of department.

## 2020-01-15 NOTE — ED Notes
Arline Asp, APRN @ bedside

## 2020-01-15 NOTE — ED Notes
Pt reporting pain and requesting pain meds. Arline Asp, APRN notified. Awaiting orders.

## 2020-02-01 ENCOUNTER — Emergency Department: Admit: 2020-02-01 | Discharge: 2020-02-01 | Payer: BC Managed Care – PPO

## 2020-02-01 ENCOUNTER — Encounter: Admit: 2020-02-01 | Discharge: 2020-02-01 | Payer: BC Managed Care – PPO

## 2020-02-01 ENCOUNTER — Emergency Department: Admit: 2020-02-01 | Discharge: 2020-02-02 | Payer: BC Managed Care – PPO

## 2020-02-01 DIAGNOSIS — M5441 Lumbago with sciatica, right side: Secondary | ICD-10-CM

## 2020-02-01 LAB — CBC AND DIFF
Lab: 0 K/UL (ref 0–0.20)
Lab: 0.1 K/UL (ref 0–0.45)
Lab: 18 (ref <20.7)
Lab: 3.8 K/UL — ABNORMAL LOW (ref <20.7)

## 2020-02-01 LAB — COMPREHENSIVE METABOLIC PANEL
Lab: 0.3 mg/dL (ref 0.3–1.2)
Lab: 0.7 mg/dL (ref 0.4–1.00)
Lab: 102 MMOL/L — ABNORMAL LOW (ref 98–110)
Lab: 102 mg/dL — ABNORMAL HIGH (ref 70–100)
Lab: 103 U/L — ABNORMAL LOW (ref 25–110)
Lab: 11 mg/dL (ref 7–25)
Lab: 12 K/UL (ref 3–12)
Lab: 137 MMOL/L (ref 137–147)
Lab: 14 U/L (ref 7–40)
Lab: 23 MMOL/L (ref 21–30)
Lab: 4.3 g/dL — ABNORMAL HIGH (ref 3.5–5.0)
Lab: 6.9 g/dL (ref 6.0–8.0)
Lab: 8 U/L (ref 7–56)
Lab: 9.3 mg/dL (ref 8.5–10.6)
Lab: >60 mL/min (ref >60)
Lab: >60 mL/min — ABNORMAL LOW (ref >60)

## 2020-02-01 LAB — LIPASE: Lab: 27 U/L — ABNORMAL LOW (ref 11–82)

## 2020-02-01 MED ORDER — SODIUM CHLORIDE 0.9 % IJ SOLN
50 mL | Freq: Once | INTRAVENOUS | 0 refills | Status: CP
Start: 2020-02-01 — End: ?
  Administered 2020-02-02: 02:00:00 50 mL via INTRAVENOUS

## 2020-02-01 MED ORDER — IOHEXOL 350 MG IODINE/ML IV SOLN
100 mL | Freq: Once | INTRAVENOUS | 0 refills | Status: CP
Start: 2020-02-01 — End: ?
  Administered 2020-02-02: 02:00:00 100 mL via INTRAVENOUS

## 2020-02-01 MED ORDER — FENTANYL CITRATE (PF) 50 MCG/ML IJ SOLN
50 ug | Freq: Once | INTRAVENOUS | 0 refills | Status: CP
Start: 2020-02-01 — End: ?
  Administered 2020-02-02: 02:00:00 50 ug via INTRAVENOUS

## 2020-02-01 MED ORDER — FENTANYL CITRATE (PF) 50 MCG/ML IJ SOLN
50 ug | Freq: Once | INTRAVENOUS | 0 refills | Status: CP
Start: 2020-02-01 — End: ?
  Administered 2020-02-02: 04:00:00 50 ug via INTRAVENOUS

## 2020-02-01 MED ORDER — ONDANSETRON HCL (PF) 4 MG/2 ML IJ SOLN
4 mg | Freq: Once | INTRAVENOUS | 0 refills | Status: CP
Start: 2020-02-01 — End: ?
  Administered 2020-02-02: 02:00:00 4 mg via INTRAVENOUS

## 2020-02-02 NOTE — ED Notes
Pt is aaox4, skin p/w/d, and eupneic on RA. Bed in low, locked position with side rails up x 2. Pt remains on cardiac monitor.
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
## 2020-02-02 NOTE — ED Notes
MD Horky @ BS with pt.
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
## 2020-02-02 NOTE — ED Notes
Back charted d/t providing patient care. C collar placed.

## 2020-02-02 NOTE — ED Notes
Report received from [place], [place]. Pt care assumed at this time.

## 2020-02-02 NOTE — ED Notes
Discharge instructions provided, pt verbalized understanding, no further questions/concerns. 22G PIV removed, catheter intact. Pt is aaox4, ABCs intact, NAD noted. Pt ambulated out of ED with steady gait.

## 2020-02-20 ENCOUNTER — Encounter: Admit: 2020-02-20 | Discharge: 2020-02-20 | Payer: BC Managed Care – PPO

## 2020-02-20 ENCOUNTER — Emergency Department: Admit: 2020-02-20 | Discharge: 2020-02-20 | Payer: BC Managed Care – PPO

## 2020-02-20 LAB — URINALYSIS DIPSTICK REFLEX TO CULTURE
Lab: 5 (ref 5.0–8.0)
Lab: NEGATIVE
Lab: NEGATIVE
Lab: NEGATIVE
Lab: NEGATIVE
Lab: NEGATIVE
Lab: NEGATIVE

## 2020-02-20 LAB — URINALYSIS MICROSCOPIC REFLEX TO CULTURE

## 2020-02-20 MED ORDER — LACTATED RINGERS IV SOLP
1000 mL | INTRAVENOUS | 0 refills | Status: CP
Start: 2020-02-20 — End: ?

## 2020-02-20 MED ORDER — ONDANSETRON HCL (PF) 4 MG/2 ML IJ SOLN
4 mg | Freq: Once | INTRAVENOUS | 0 refills | Status: CP
Start: 2020-02-20 — End: ?

## 2020-02-20 MED ORDER — ACETAMINOPHEN/LIDOCAINE/ANTACID DS(#) 1:1:3  PO SUSP
30 mL | Freq: Once | ORAL | 0 refills | Status: CP
Start: 2020-02-20 — End: ?

## 2020-02-20 MED ORDER — MORPHINE 2 MG/ML IV SYRG
4 mg | Freq: Once | INTRAVENOUS | 0 refills | Status: CP
Start: 2020-02-20 — End: ?

## 2020-02-21 ENCOUNTER — Encounter: Admit: 2020-02-21 | Discharge: 2020-02-21 | Payer: BC Managed Care – PPO

## 2020-02-21 ENCOUNTER — Emergency Department: Admit: 2020-02-21 | Discharge: 2020-02-21 | Payer: BC Managed Care – PPO

## 2020-02-21 MED ADMIN — SODIUM CHLORIDE 0.9 % IJ SOLN [7319]: 50 mL | INTRAVENOUS | @ 06:00:00 | Stop: 2020-02-21 | NDC 00409488820

## 2020-02-21 MED ADMIN — OXYCODONE 5 MG PO TAB [10814]: 5 mg | ORAL | @ 10:00:00 | Stop: 2020-02-21 | NDC 00904696661

## 2020-02-21 MED ADMIN — IOHEXOL 350 MG IODINE/ML IV SOLN [81210]: 100 mL | INTRAVENOUS | @ 06:00:00 | Stop: 2020-02-21 | NDC 00407141491

## 2020-02-21 MED ADMIN — MORPHINE 2 MG/ML IV SYRG [320653]: 4 mg | INTRAVENOUS | @ 07:00:00 | Stop: 2020-02-21 | NDC 00409189003

## 2020-02-29 ENCOUNTER — Encounter: Admit: 2020-02-29 | Discharge: 2020-02-29 | Payer: BC Managed Care – PPO

## 2020-02-29 ENCOUNTER — Emergency Department: Admit: 2020-02-29 | Discharge: 2020-03-01 | Discharge status: Against Medical Advice | Payer: BC Managed Care – PPO

## 2020-02-29 DIAGNOSIS — K626 Ulcer of anus and rectum: Secondary | ICD-10-CM

## 2020-02-29 DIAGNOSIS — K760 Fatty (change of) liver, not elsewhere classified: Secondary | ICD-10-CM

## 2020-02-29 DIAGNOSIS — F419 Anxiety disorder, unspecified: Secondary | ICD-10-CM

## 2020-02-29 DIAGNOSIS — K429 Umbilical hernia without obstruction or gangrene: Secondary | ICD-10-CM

## 2020-02-29 DIAGNOSIS — N823 Fistula of vagina to large intestine: Secondary | ICD-10-CM

## 2020-02-29 DIAGNOSIS — K219 Gastro-esophageal reflux disease without esophagitis: Secondary | ICD-10-CM

## 2020-02-29 DIAGNOSIS — K5730 Diverticulosis of large intestine without perforation or abscess without bleeding: Secondary | ICD-10-CM

## 2020-02-29 DIAGNOSIS — D649 Anemia, unspecified: Secondary | ICD-10-CM

## 2020-02-29 DIAGNOSIS — E785 Hyperlipidemia, unspecified: Secondary | ICD-10-CM

## 2020-02-29 LAB — POC CREATININE, RAD: Lab: 0.8 mg/dL (ref 0.4–1.00)

## 2020-02-29 LAB — COVID-19 (SARS-COV-2) PCR

## 2020-02-29 LAB — CBC AND DIFF
Lab: 0 K/UL (ref 0–0.20)
Lab: 0.1 K/UL (ref 0–0.45)
Lab: 0.3 K/UL (ref 0–0.80)
Lab: 0.3 K/UL — ABNORMAL LOW (ref 1.0–4.8)
Lab: 1 % (ref 0–2)
Lab: 10 % (ref 4–12)
Lab: 11 % — ABNORMAL LOW (ref 24–44)
Lab: 14 % (ref 11–15)
Lab: 16 (ref <20.7)
Lab: 2.3 K/UL (ref 1.8–7.0)
Lab: 26 % — ABNORMAL LOW (ref 36–45)
Lab: 26 pg (ref 26–34)
Lab: 268 K/UL (ref 150–400)
Lab: 3.3 M/UL — ABNORMAL LOW (ref 4.0–5.0)
Lab: 4 % (ref >60)
Lab: 7.5 FL (ref 7–11)
Lab: 79 FL — ABNORMAL LOW (ref 80–100)
Lab: 8.6 g/dL — ABNORMAL LOW (ref 12.0–15.0)

## 2020-02-29 LAB — POC LACTATE: Lab: 1.4 MMOL/L (ref 0.5–2.0)

## 2020-02-29 LAB — LIPASE: Lab: 26 U/L (ref 11–82)

## 2020-02-29 LAB — COMPREHENSIVE METABOLIC PANEL
Lab: 0.2 mg/dL — ABNORMAL LOW (ref 0.3–1.2)
Lab: 107 MMOL/L — ABNORMAL LOW (ref 98–110)
Lab: 141 MMOL/L (ref 137–147)
Lab: 24 MMOL/L (ref 21–30)

## 2020-02-29 MED ORDER — ONDANSETRON HCL (PF) 4 MG/2 ML IJ SOLN
4 mg | Freq: Once | INTRAVENOUS | 0 refills | Status: CP
Start: 2020-02-29 — End: ?
  Administered 2020-03-01: 02:00:00 4 mg via INTRAVENOUS

## 2020-02-29 MED ORDER — HYOSCYAMINE SULFATE 0.125 MG PO TBDI
.125 mg | Freq: Once | SUBLINGUAL | 0 refills | Status: CP
Start: 2020-02-29 — End: ?
  Administered 2020-03-01: 03:00:00 0.125 mg via SUBLINGUAL

## 2020-02-29 MED ORDER — LACTATED RINGERS IV SOLP
1000 mL | Freq: Once | INTRAVENOUS | 0 refills | Status: CP
Start: 2020-02-29 — End: ?
  Administered 2020-03-01: 01:00:00 1000 mL via INTRAVENOUS

## 2020-02-29 MED ORDER — FAMOTIDINE (PF) 20 MG/2 ML IV SOLN
40 mg | Freq: Once | INTRAVENOUS | 0 refills | Status: DC
Start: 2020-02-29 — End: 2020-03-01

## 2020-02-29 MED ORDER — FENTANYL CITRATE (PF) 50 MCG/ML IJ SOLN
50 ug | Freq: Once | INTRAVENOUS | 0 refills | Status: CP
Start: 2020-02-29 — End: ?
  Administered 2020-03-01: 01:00:00 50 ug via INTRAVENOUS

## 2020-02-29 NOTE — ED Notes
Pt c/o that "I haven't seen a doctor" - Dr Earnest Bailey notified

## 2020-03-01 ENCOUNTER — Encounter: Admit: 2020-03-01 | Discharge: 2020-03-01 | Payer: BC Managed Care – PPO

## 2020-03-01 NOTE — ED Notes
Pt states" I'm leaving. You aren't doing anything to help me" offered pt to speak with Dr - pt refused.  Pt refused Pepcid-     IV removed- pt able to get dressed without assistance, gait steady all belongings with pt     Pt did not vomit or have diarrhea while in ER

## 2020-03-01 NOTE — ED Notes
Spoke to Dr Willaim Bane Re: LOS- states will contact Dr Samuella Cota

## 2020-03-01 NOTE — Telephone Encounter
Spoke with patient and verified full name and date of birth. Informed patient of NEGATIVE COVID 19 testing. Patient was asked if they were still experiencing any symptoms and responded with Abdominal pain. Patient was advised to contact PCP, specialist, and/or ordering provider for ongoing symptoms. Patient had no further questions.

## 2020-03-22 ENCOUNTER — Encounter: Admit: 2020-03-22 | Discharge: 2020-03-22 | Payer: Private Health Insurance - Indemnity

## 2020-03-22 ENCOUNTER — Emergency Department: Admit: 2020-03-22 | Discharge: 2020-03-22 | Payer: Private Health Insurance - Indemnity

## 2020-03-22 DIAGNOSIS — R339 Retention of urine, unspecified: Secondary | ICD-10-CM

## 2020-03-22 DIAGNOSIS — R109 Unspecified abdominal pain: Secondary | ICD-10-CM

## 2020-03-22 LAB — URINALYSIS DIPSTICK REFLEX TO CULTURE
Lab: NEGATIVE U/L (ref 7–40)
Lab: NEGATIVE U/L (ref 7–56)
Lab: NEGATIVE g/dL (ref 6.0–8.0)
Lab: NEGATIVE g/dL — ABNORMAL HIGH (ref 3.5–5.0)
Lab: NEGATIVE mg/dL (ref 0.3–1.2)

## 2020-03-22 LAB — COMPREHENSIVE METABOLIC PANEL
Lab: 12 K/UL (ref 3–12)
Lab: 140 MMOL/L — ABNORMAL LOW (ref >60)
Lab: >60 mL/min — ABNORMAL LOW (ref >60)

## 2020-03-22 LAB — URINALYSIS MICROSCOPIC REFLEX TO CULTURE

## 2020-03-22 LAB — CBC AND DIFF
Lab: 0 K/UL (ref 0–0.20)
Lab: 0.1 K/UL (ref 0–0.45)
Lab: 0.4 K/UL (ref 0–0.80)
Lab: 16 (ref <20.7)
Lab: 6.2 K/UL (ref 4.5–11.0)

## 2020-03-22 MED ORDER — ONDANSETRON HCL (PF) 4 MG/2 ML IJ SOLN
4 mg | Freq: Once | INTRAVENOUS | 0 refills | Status: CP
Start: 2020-03-22 — End: ?
  Administered 2020-03-22: 17:00:00 4 mg via INTRAVENOUS

## 2020-03-22 MED ORDER — PHENAZOPYRIDINE 200 MG PO TAB
200 mg | Freq: Once | ORAL | 0 refills | Status: DC
Start: 2020-03-22 — End: 2020-03-22

## 2020-03-22 MED ORDER — IBUPROFEN 600 MG PO TAB
600 mg | Freq: Once | ORAL | 0 refills | Status: DC
Start: 2020-03-22 — End: 2020-03-22

## 2020-03-22 MED ORDER — LACTATED RINGERS IV SOLP
1000 mL | INTRAVENOUS | 0 refills | Status: CP
Start: 2020-03-22 — End: ?
  Administered 2020-03-22: 17:00:00 1000 mL via INTRAVENOUS

## 2020-03-22 MED ORDER — FENTANYL CITRATE (PF) 50 MCG/ML IJ SOLN
50 ug | Freq: Once | INTRAVENOUS | 0 refills | Status: CP
Start: 2020-03-22 — End: ?
  Administered 2020-03-22: 17:00:00 50 ug via INTRAVENOUS

## 2020-03-22 MED ORDER — LIDOCAINE 5 % TP PTMD
1 | Freq: Once | TOPICAL | 0 refills | Status: DC
Start: 2020-03-22 — End: 2020-03-22

## 2020-03-22 NOTE — ED Notes
Pt refused medications and refused to stay for UA results. PIV dc'd and pt ambulated with steady,unassisted gait to exit. All belongings with patient at time of leaving building.

## 2020-03-22 NOTE — ED Notes
PT states she is still in pain, [name], APRN notified.

## 2020-03-22 NOTE — ED Notes
Tanya French is a 21F presenting w/cc of Rt Flank area pain with nausea and vomiting x 4 since this a.m. Took Tylenol for pain around 0700. Reports having had kidney stones several years ago and no issues since then, some pain reported with urination. Pt denies any recent illnesses/exposures. Is A& O x 4, GCS 15, skin is hot and dry. Pt appears uncomfortable, is tachycardic on monitor and oral temp is 99.28f. Pt assumes responsibility of all belongings. Placed on SpO2/BP monitoring, siderails up x2, call light in reach.    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

## 2020-04-08 ENCOUNTER — Emergency Department: Admit: 2020-04-08 | Discharge: 2020-04-08 | Payer: Private Health Insurance - Indemnity

## 2020-04-08 ENCOUNTER — Encounter: Admit: 2020-04-08 | Discharge: 2020-04-08 | Payer: Private Health Insurance - Indemnity

## 2020-04-08 DIAGNOSIS — E785 Hyperlipidemia, unspecified: Secondary | ICD-10-CM

## 2020-04-08 DIAGNOSIS — D649 Anemia, unspecified: Secondary | ICD-10-CM

## 2020-04-08 DIAGNOSIS — K219 Gastro-esophageal reflux disease without esophagitis: Secondary | ICD-10-CM

## 2020-04-08 DIAGNOSIS — N823 Fistula of vagina to large intestine: Secondary | ICD-10-CM

## 2020-04-08 DIAGNOSIS — K760 Fatty (change of) liver, not elsewhere classified: Secondary | ICD-10-CM

## 2020-04-08 DIAGNOSIS — K429 Umbilical hernia without obstruction or gangrene: Secondary | ICD-10-CM

## 2020-04-08 DIAGNOSIS — K626 Ulcer of anus and rectum: Secondary | ICD-10-CM

## 2020-04-08 DIAGNOSIS — F419 Anxiety disorder, unspecified: Secondary | ICD-10-CM

## 2020-04-08 DIAGNOSIS — K5730 Diverticulosis of large intestine without perforation or abscess without bleeding: Secondary | ICD-10-CM

## 2020-04-08 MED ORDER — MORPHINE 2 MG/ML IV SYRG
4 mg | Freq: Once | INTRAVENOUS | 0 refills | Status: DC
Start: 2020-04-08 — End: 2020-04-08

## 2020-04-08 MED ORDER — LIDOCAINE 5 % TP PTMD
1 | Freq: Once | TOPICAL | 0 refills | Status: DC
Start: 2020-04-08 — End: 2020-04-09

## 2020-04-08 MED ORDER — ONDANSETRON HCL (PF) 4 MG/2 ML IJ SOLN
4 mg | Freq: Once | INTRAVENOUS | 0 refills | Status: DC
Start: 2020-04-08 — End: 2020-04-08

## 2020-04-08 MED ORDER — ONDANSETRON 4 MG PO TBDI
4 mg | Freq: Once | ORAL | 0 refills | Status: DC
Start: 2020-04-08 — End: 2020-04-09
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
## 2020-04-19 ENCOUNTER — Encounter

## 2020-04-19 ENCOUNTER — Emergency Department: Payer: Private Health Insurance - Indemnity

## 2020-04-19 DIAGNOSIS — R1032 Left lower quadrant pain: Secondary | ICD-10-CM

## 2020-04-19 DIAGNOSIS — D649 Anemia, unspecified: Secondary | ICD-10-CM

## 2020-04-19 DIAGNOSIS — K429 Umbilical hernia without obstruction or gangrene: Secondary | ICD-10-CM

## 2020-04-19 DIAGNOSIS — K219 Gastro-esophageal reflux disease without esophagitis: Secondary | ICD-10-CM

## 2020-04-19 DIAGNOSIS — N823 Fistula of vagina to large intestine: Secondary | ICD-10-CM

## 2020-04-19 DIAGNOSIS — E785 Hyperlipidemia, unspecified: Secondary | ICD-10-CM

## 2020-04-19 DIAGNOSIS — F419 Anxiety disorder, unspecified: Secondary | ICD-10-CM

## 2020-04-19 DIAGNOSIS — K6289 Other specified diseases of anus and rectum: Secondary | ICD-10-CM

## 2020-04-19 DIAGNOSIS — K626 Ulcer of anus and rectum: Secondary | ICD-10-CM

## 2020-04-19 DIAGNOSIS — K5730 Diverticulosis of large intestine without perforation or abscess without bleeding: Secondary | ICD-10-CM

## 2020-04-19 DIAGNOSIS — R109 Unspecified abdominal pain: Secondary | ICD-10-CM

## 2020-04-19 DIAGNOSIS — R52 Pain, unspecified: Secondary | ICD-10-CM

## 2020-04-19 DIAGNOSIS — K760 Fatty (change of) liver, not elsewhere classified: Secondary | ICD-10-CM

## 2020-04-19 LAB — URINALYSIS MICROSCOPIC REFLEX TO CULTURE

## 2020-04-19 LAB — COMPREHENSIVE METABOLIC PANEL
Lab: 0.3 mg/dL (ref 0.3–1.2)
Lab: 0.7 mg/dL (ref 0.4–1.00)
Lab: 104 MMOL/L — ABNORMAL LOW (ref 98–110)
Lab: 11 mg/dL — ABNORMAL LOW (ref 7–25)
Lab: 113 U/L — ABNORMAL HIGH (ref 25–110)
Lab: 139 MMOL/L — ABNORMAL LOW (ref 137–147)
Lab: 25 MMOL/L (ref 21–30)
Lab: 7.6 g/dL (ref 6.0–8.0)
Lab: 9 U/L (ref 7–56)
Lab: 9.2 mg/dL — ABNORMAL HIGH (ref 8.5–10.6)
Lab: >60 mL/min — ABNORMAL LOW (ref >60)

## 2020-04-19 LAB — CBC AND DIFF
Lab: 0.3 K/UL (ref 0–0.80)
Lab: 3.1 K/UL (ref 1.8–7.0)
Lab: 4.1 K/UL — ABNORMAL LOW (ref 4.5–11.0)
Lab: 76 FL — ABNORMAL LOW (ref 80–100)
Lab: 78 % — ABNORMAL HIGH (ref 41–77)
Lab: 8 % (ref 4–12)
Lab: 9.3 g/dL — ABNORMAL LOW (ref 12.0–15.0)

## 2020-04-19 LAB — URINALYSIS DIPSTICK REFLEX TO CULTURE
Lab: NEGATIVE
Lab: NEGATIVE
Lab: NEGATIVE
Lab: NEGATIVE
Lab: NEGATIVE
Lab: NEGATIVE
Lab: NEGATIVE

## 2020-04-19 LAB — C REACTIVE PROTEIN (CRP): Lab: 0.6 mg/dL (ref <1.0)

## 2020-04-19 LAB — COVID-19 (SARS-COV-2) PCR

## 2020-04-19 LAB — POC LACTATE: Lab: 1 MMOL/L (ref 0.5–2.0)

## 2020-04-19 MED ORDER — DULOXETINE 60 MG PO CPDR
120 mg | Freq: Every evening | ORAL | 0 refills | Status: DC
Start: 2020-04-19 — End: 2020-04-20

## 2020-04-19 MED ORDER — SODIUM CHLORIDE 0.9 % IJ SOLN
50 mL | Freq: Once | INTRAVENOUS | 0 refills | Status: CP
Start: 2020-04-19 — End: ?
  Administered 2020-04-19: 20:00:00 50 mL via INTRAVENOUS

## 2020-04-19 MED ORDER — PANTOPRAZOLE 40 MG PO TBEC
40 mg | Freq: Every day | ORAL | 0 refills | Status: DC
Start: 2020-04-19 — End: 2020-04-20

## 2020-04-19 MED ORDER — LACTATED RINGERS IV SOLP
1000 mL | Freq: Once | INTRAVENOUS | 0 refills | Status: CP
Start: 2020-04-19 — End: ?
  Administered 2020-04-19: 19:00:00 1000 mL via INTRAVENOUS

## 2020-04-19 MED ORDER — ONDANSETRON HCL (PF) 4 MG/2 ML IJ SOLN
4 mg | INTRAVENOUS | 0 refills | Status: DC | PRN
Start: 2020-04-19 — End: 2020-04-20
  Administered 2020-04-19: 19:00:00 4 mg via INTRAVENOUS

## 2020-04-19 MED ORDER — HYDROMORPHONE (PF) 2 MG/ML IJ SYRG
1 mg | Freq: Once | INTRAVENOUS | 0 refills | Status: CP
Start: 2020-04-19 — End: ?
  Administered 2020-04-19: 21:00:00 1 mg via INTRAVENOUS

## 2020-04-19 MED ORDER — ONDANSETRON HCL (PF) 4 MG/2 ML IJ SOLN
4 mg | INTRAVENOUS | 0 refills | Status: DC | PRN
Start: 2020-04-19 — End: 2020-04-20

## 2020-04-19 MED ORDER — SIMVASTATIN 40 MG PO TAB
40 mg | Freq: Every evening | ORAL | 0 refills | Status: DC
Start: 2020-04-19 — End: 2020-04-20

## 2020-04-19 MED ORDER — METOPROLOL SUCCINATE 50 MG PO TB24
50 mg | Freq: Every day | ORAL | 0 refills | Status: DC
Start: 2020-04-19 — End: 2020-04-20

## 2020-04-19 MED ORDER — FENTANYL CITRATE (PF) 50 MCG/ML IJ SOLN
50 ug | Freq: Once | INTRAVENOUS | 0 refills | Status: CP
Start: 2020-04-19 — End: ?

## 2020-04-19 MED ORDER — OXYCODONE 5 MG PO TAB
5 mg | ORAL | 0 refills | Status: DC | PRN
Start: 2020-04-19 — End: 2020-04-20

## 2020-04-19 MED ORDER — IOHEXOL 350 MG IODINE/ML IV SOLN
100 mL | Freq: Once | INTRAVENOUS | 0 refills | Status: CP
Start: 2020-04-19 — End: ?
  Administered 2020-04-19: 20:00:00 100 mL via INTRAVENOUS

## 2020-04-19 MED ORDER — FENTANYL CITRATE (PF) 50 MCG/ML IJ SOLN
25 ug | Freq: Once | INTRAVENOUS | 0 refills | Status: CP
Start: 2020-04-19 — End: ?
  Administered 2020-04-19: 19:00:00 25 ug via INTRAVENOUS

## 2020-04-19 MED ORDER — DICYCLOMINE 20 MG PO TAB
20 mg | ORAL | 0 refills | Status: DC
Start: 2020-04-19 — End: 2020-04-20

## 2020-04-19 MED ORDER — MORPHINE 15 MG PO TAB
7.5-15 mg | ORAL | 0 refills | Status: DC | PRN
Start: 2020-04-19 — End: 2020-04-20

## 2020-04-19 MED ORDER — LIDOCAINE 5 % TP PTMD
1 | Freq: Every day | TOPICAL | 0 refills | Status: DC
Start: 2020-04-19 — End: 2020-04-20

## 2020-04-19 MED ADMIN — FENTANYL CITRATE (PF) 50 MCG/ML IJ SOLN [3037]: 50 ug | INTRAVENOUS | @ 20:00:00 | Stop: 2020-04-19 | NDC 00409909412

## 2020-04-19 NOTE — ED Provider Notes
Tanya French is a 55 y.o. female.    Chief Complaint:  Chief Complaint   Patient presents with   ? Melena   ? 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 Patient reports that the abdominal pain and bleeding started last night and is associated with nausea and vomiting x 5 episodes. Describes blood in stool as Bright red and denies any hematemesis. Denies fever, chills associated with pain. States presentation similar to both prior ulcerative colitis and diverticulitis flare ups. Last flare up was one week ago, patient states she was evaluated at that time at [facility] and ultimately discharged home. Currently on Sulfaslazine and Bentyl for Ulcerative colitis, does not follow with GI and managed by PCP per patient. ROS otherwise as noted below.     Review of Systems:  Review of Systems   Constitutional: Negative for chills, diaphoresis, fatigue and fever.   HENT: Negative for mouth sores and sore throat.    Eyes: Negative for photophobia and visual disturbance.   Respiratory: Negative for cough, chest tightness, shortness of breath and wheezing.    Cardiovascular: Negative for chest pain and leg swelling.   Gastrointestinal: Positive for abdominal pain, blood in stool, nausea, rectal pain and vomiting. Negative for diarrhea.   Genitourinary: Negative for difficulty urinating, dysuria, hematuria, pelvic pain, urgency, vaginal bleeding, vaginal discharge and vaginal pain.   Skin: Negative for rash.   Neurological: Negative for dizziness, weakness, light-headedness and numbness.     Allergies:  Toradol [ketorolac], Keflex [cephalexin], Compazine [prochlorperazine], and Iodinated contrast media    Past Medical History:  Medical History:   Diagnosis Date   ? Anemia    ? Anxiety    ? GERD (gastroesophageal reflux disease)    ? Hepatic steatosis    ? Hyperlipidemia    ? Periumbilical hernia    ? Rectal ulcer    ? 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 Sigmoid diverticulosis        Past Surgical History:  Surgical History:   Procedure Laterality Date   ? ACL RECONSTRUCTION  2008   ? HYSTERECTOMY  2009    R oophorectomy   ? APPENDECTOMY  2009   ? CHOLECYSTECTOMY  2009   ? OOPHORECTOMY  2010    L oophorectomy   ? CARPAL TUNNEL RELEASE  2012   ? SIGMOIDOSCOPY DIAGNOSTIC N/A 03/20/2015    Performed by Tim Lair, MD at [facility] ENDO   ? SIGMOIDOSCOPY BIOPSY  03/20/2015    Performed by Tim Lair, [facility] at [facility] ENDO   ? COLONOSCOPY N/A 08/05/2016    Performed by Tommie Sams, MD at [facility] ENDO   ? COLONOSCOPY BIOPSY  08/05/2016    Performed by Tommie Sams, [facility] at [facility] ENDO   ? PLANTAR FASCIA SURGERY         Pertinent medical/surgical history reviewed    Social History:  Social History     Tobacco Use   ? Smoking status: Former Smoker     Packs/day: 0.50     Years: 20.00     Pack years: 10.00     Types: Cigarettes   ? Smokeless tobacco: Never Used  Vaping Use   ? Vaping Use: Never used   Substance Use Topics   ? Alcohol use: Yes     Comment: social   ? Drug use: No     Social History     Substance and Sexual Activity   Drug Use No             Family History:  Family History   Problem Relation Age of Onset   ? Cancer Maternal Grandmother         colon CA at age 17   ? Diabetes Father    ? Heart Attack Father    ? Hypertension Father    ? Other Father         arrhythmias   ? Hypertension Mother    ? Other Mother         COPD       Vitals:  ED Vitals    Date and Time T BP P RR SPO2P SPO2 User   04/19/20 1546 -- -- 74 11 PER MINUTE 75 96 % RH   04/19/20 1328 -- -- 75 18 PER MINUTE 75 97 % RH   04/19/20 1159 36.9 ?C (98.5 ?F) -- 101 19 PER MINUTE 100 100 % NW   04/19/20 1156 -- 143/103 -- -- -- -- NW          Physical Exam:  Physical Exam  Constitutional:       General: She is not in acute distress.     Appearance: Normal appearance. She is obese. She is not ill-appearing.   HENT:      Head: Normocephalic and atraumatic.      Nose: Nose normal. No congestion or rhinorrhea.      Mouth/Throat:      Mouth: Mucous membranes are moist.      Pharynx: No oropharyngeal exudate or posterior oropharyngeal erythema.   Eyes:      General: No scleral icterus.     Extraocular Movements: Extraocular movements intact.      Conjunctiva/sclera: Conjunctivae normal.   Cardiovascular:      Rate and Rhythm: Normal rate and regular rhythm.      Pulses: Normal pulses.      Heart sounds: Normal heart sounds. No murmur heard.    No gallop.   Pulmonary:      Effort: Pulmonary effort is normal. No respiratory distress.      Breath sounds: Normal breath sounds. No wheezing.   Abdominal:      General: Bowel sounds are normal. There is no distension.      Palpations: Abdomen is soft.      Tenderness: There is abdominal tenderness. There is no guarding or rebound.      Comments: LLQ tenderness, remainder of abdomen soft   Genitourinary:     Comments: Nursing present for entirety of exam. No rectal ulcers or hemorrhoids noted. Positive right red blood per rectum; however, minimal stool present  Musculoskeletal:         General: No swelling.      Cervical back: Neck supple.      Right lower leg: No edema.      Left lower leg: No edema.   Skin:     General: Skin is warm and dry.   Neurological:      General: No focal deficit present.      Mental Status: She is alert and oriented to person, place, and time. Mental status is at baseline.  Cranial Nerves: No cranial nerve deficit.      Motor: No weakness.   Psychiatric:         Mood and Affect: Mood normal.         Laboratory Results:  Labs Reviewed   CBC AND DIFF - Abnormal       Result Value Ref Range Status    White Blood Cells 4.1 (*) 4.5 - 11.0 K/UL Final    RBC 3.75 (*) 4.0 - 5.0 M/UL Final    Hemoglobin 9.3 (*) 12.0 - 15.0 GM/DL Final    Hematocrit 19.1 (*) 36 - 45 % Final    MCV 76.2 (*) 80 - 100 FL Final    MCH 24.7 (*) 26 - 34 PG Final    MCHC 32.5  32.0 - 36.0 G/DL Final    RDW 47.8 (*) 11 - 15 % Final    Platelet Count 292  150 - 400 K/UL Final    MPV 7.7  7 - 11 FL Final    Neutrophils 78 (*) 41 - 77 % Final    Lymphocytes 10 (*) 24 - 44 % Final    Monocytes 8  4 - 12 % Final    Eosinophils 3  0 - 5 % Final    Basophils 1  0 - 2 % Final    Absolute Neutrophil Count 3.19  1.8 - 7.0 K/UL Final    Absolute Lymph Count 0.40 (*) 1.0 - 4.8 K/UL Final    Absolute Monocyte Count 0.32  0 - 0.80 K/UL Final    Absolute Eosinophil Count 0.12  0 - 0.45 K/UL Final    Absolute Basophil Count 0.03  0 - 0.20 K/UL Final    MDW (Monocyte Distribution Width) 15.2  <20.7 Final   COMPREHENSIVE METABOLIC PANEL - Abnormal    Sodium 139  137 - 147 MMOL/L Final    Potassium 3.7  3.5 - 5.1 MMOL/L Final    Chloride 104  98 - 110 MMOL/L Final    Glucose 102 (*) 70 - 100 MG/DL Final    Blood Urea Nitrogen 11  7 - 25 MG/DL Final    Creatinine 2.95  0.4 - 1.00 MG/DL Final    Calcium 9.2  8.5 - 10.6 MG/DL Final    Total Protein 7.6  6.0 - 8.0 G/DL Final    Total Bilirubin 0.3  0.3 - 1.2 MG/DL Final    Albumin 4.3  3.5 - 5.0 G/DL Final    Alk Phosphatase 113 (*) 25 - 110 U/L Final    AST (SGOT) 11  7 - 40 U/L Final    CO2 25  21 - 30 MMOL/L Final    ALT (SGPT) 9  7 - 56 U/L Final    Anion Gap 10  3 - 12 Final    eGFR >60  >60 mL/min Final   COVID-19 (SARS-COV-2) PCR   URINALYSIS DIPSTICK REFLEX TO CULTURE    Color,UA YELLOW   Final    Turbidity,UA CLEAR  CLEAR-CLEAR Final    Specific Gravity-Urine 1.017  1.005 - 1.030 Final    pH,UA 6.0  5.0 - 8.0 Final    Protein,UA NEG  NEG-NEG Final    Glucose,UA NEG  NEG-NEG Final    Ketones,UA NEG  NEG-NEG Final    Bilirubin,UA NEG  NEG-NEG Final    Blood,UA NEG  NEG-NEG Final    Urobilinogen,UA NORMAL  NORM-NORMAL Final    Nitrite,UA NEG  NEG-NEG Final  Leukocytes,UA NEG NEG-NEG Final    Urine Ascorbic Acid, UA NEG  NEG-NEG Final   URINALYSIS MICROSCOPIC REFLEX TO CULTURE    WBCs,UA 0-2  0 - 2 /HPF Final    RBCs,UA 0-2  0 - 3 /HPF Final    Comment,UA     Final    Value: Criteria for reflex to culture are WBC>10, Positive Nitrite, and/or >=+1   leukocytes. If quantity is not sufficient, an addendum will follow.      MucousUA TRACE   Final    Squamous Epithelial Cells 0-2  0 - 5 Final   C REACTIVE PROTEIN (CRP)    C-Reactive Protein 0.61  <1.0 MG/DL Final   POC LACTATE    LACTIC ACID POC 1.0  0.5 - 2.0 MMOL/L Final   UA REFLEX LABEL   POC LACTATE   POC LACTATE   POC URINE PREGNANCY          Radiology Interpretation:  CT Abd/Pelvis IMPRESSION     1. ?No bowel obstruction, inflammatory mass, ascites, or free air.   2. ?Mild distal colonic diverticulosis.   3. ?Mild hepatosplenomegaly with probable mild diffuse hepatic steatosis.     EKG:  N/A    ED Course:  Patient evaluated with attending physician, vital signs stable on presentation; however, tachycardia noted prior to evaluation. Given history noted above, CT Abd/Pelvis with contrast ordered (Listed allergy; however, has tolerated contrast without reaction previously) and noted above. Labs obtained; hgb stable, WBC 4.1 (decreased from previous). Lactic acid 1.0, CRP 0.61. Given findings, no antibiotics given as patient does not meet SIRS criteria and no evidence of infection. Given recurrent symptoms, uncontrolled pain and history, medicine consulted for admission and accepted. Patient updated and all questions answered.        ED Scoring:      MDM  Reviewed: previous chart, nursing note and vitals  Reviewed previous: labs and CT scan  Interpretation: labs and CT scan       Facility Administered Meds:  Medications   ondansetron (ZOFRAN) injection 4 mg (4 mg Intravenous Given 04/19/20 1259)   fentaNYL citrate PF (SUBLIMAZE) injection 25 mcg (25 mcg Intravenous Given 04/19/20 1259)   lactated ringers infusion (0 mL Intravenous Infusion Stopped 04/19/20 1423)   fentaNYL citrate PF (SUBLIMAZE) injection 50 mcg (50 mcg Intravenous Given 04/19/20 1422)   HYDROmorphone injection (DILAUDID) 1 mg (1 mg Intravenous Given 04/19/20 1523)         Clinical Impression:  Clinical Impression   Left lower quadrant abdominal pain   Rectal pain   Uncontrolled pain       Disposition/Follow up  ED Disposition     ED Disposition    Admit        No follow-up provider specified.    Medications:  New Prescriptions    No medications on file       Procedure Notes:  Procedures    Emilee Hero, MD    Attestation / Supervision:

## 2020-04-19 NOTE — H&P (View-Only)
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 Stated taking sulfasalazine  - Last colonoscopy at [place] in 2018 with multiple ulcerations. Biopsy with ulceration with fibrinopurulent exudate, focally hyalinized lamina   propria, and surface hyperplastic changes.   - Noted very frequent visits to ED in multiple hospitals with same complaints. Last was seen in [facility] week ago and no significant abnormalities found  - Concern for drug seeking behavior as was asking for opioids  - No leukocytosis, CRP normal  - Per ED resident DRE with minimal stool, no rectal ulcer, no hemorrhoids, Hemoccult +  - CT scan with few diverticula, no diverticulitis  - Continue PTA regimen of bentyl and sulfasalazine  - Clear liquids for now  - GI consult re need in colonoscopy  - Continue sulfasalazine  - Pain control, antiemetics, bentyl, Lidoderm patch    GERD  - Continue PPI    HLD  - Continue statin    Microcytic anemia, suspect IDA  - Hgb 9, MCB 76  - Hold ferrous sulfate while inpatient  - Check iron studies in am    VTE proph: SCDs, hold heparin for now    Code status; Full code    Disposition: observation  __________________________________________________________________________________  Primary Care Physician: No Pcp, Na Follows [facility]    Chief Complaint: abdominal pain, blood in stool  History of Present Illness: Tanya French is a 55 y.o. 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 No hematemesis. Denies fever, chills associated with pain. States presentation similar to both prior ulcerative colitis and diverticulitis flare ups. Last flare up was one week ago, patient states she was evaluated at that time at [facility] and ultimately discharged home. Currently on Sulfaslazine and Bentyl for Ulcerative colitis, does not follow with GI and managed by PCP per patient. ROS otherwise as noted below. Per care everywhere patient had visits to multiple EDs and sometimes was leaving AMA when not getting IV opioids.     Medical History:   Diagnosis Date   ? Anemia    ? Anxiety    ? GERD (gastroesophageal reflux disease)    ? Hepatic steatosis    ? Hyperlipidemia    ? Periumbilical hernia    ? Rectal ulcer    ?  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 Sigmoid diverticulosis      Surgical History:   Procedure Laterality Date   ? ACL RECONSTRUCTION  2008   ? HYSTERECTOMY  2009    R oophorectomy   ? APPENDECTOMY  2009   ? CHOLECYSTECTOMY  2009   ? OOPHORECTOMY  2010    L oophorectomy   ? CARPAL TUNNEL RELEASE  2012   ? SIGMOIDOSCOPY DIAGNOSTIC N/A 03/20/2015    Performed by Tim Lair, MD at [facility] ENDO   ? SIGMOIDOSCOPY BIOPSY  03/20/2015    Performed by Tim Lair, MD at [facility] ENDO   ? COLONOSCOPY N/A 08/05/2016    Performed by Tommie Sams, [facility] at [facility] ENDO   ? COLONOSCOPY BIOPSY  08/05/2016    Performed by Tommie Sams, MD at [facility] ENDO   ? PLANTAR FASCIA SURGERY       Family history reviewed; non-contributory  Social History Socioeconomic History   ? Marital status: Divorced     Spouse name: Not on file   ? Number of children: Not on file   ? Years of education: Not on file   ? Highest education level: Not on file   Occupational History   ? Not on file   Tobacco Use   ? Smoking status: Former Smoker     Packs/day: 0.50     Years: 20.00     Pack years: 10.00     Types: Cigarettes   ? Smokeless tobacco: Never Used   Vaping Use   ? Vaping Use: Never used   Substance and Sexual Activity   ? Alcohol use: Yes     Comment: social   ? Drug use: No   ? Sexual activity: Not on file   Other Topics Concern   ? Not on file   Social History Narrative   ? Not on file     Social Determinants of Health     Financial Resource Strain: Not on file   Food Insecurity: Not on file   Transportation Needs: Not on file   Physical Activity: Not on file   Stress: Not on file   Social Connections: Not on file   Intimate Partner Violence: Not on file   Housing Stability: Not on file      Vaping/E-liquid Use   ? Vaping Use Never User                 Immunizations (includes history and patient reported):   There is no immunization history on file for this patient.        Allergies:  Toradol [ketorolac], Keflex [cephalexin], Compazine [prochlorperazine], and Iodinated contrast media    Medications:  Current Facility-Administered Medications   Medication   ? ondansetron (ZOFRAN) injection 4 mg     Current Outpatient Medications   Medication Sig   ? ALPRAZolam XR(+) (XANAX XR) 2 mg tablet Take 2 mg by mouth every morning.   ? azithromycin (ZITHROMAX) 250 mg tablet Take two pills today and then one per day until completion   ? dicyclomine (BENTYL) 20 mg tablet Take 1 Tab by mouth every 6 hours.   ? duloxetine DR (CYMBALTA) 60 mg capsule Take 120 mg by mouth at bedtime daily.   ? ferrous sulfate (FEOSOL, FEROSUL) 325 mg (65 mg iron) tablet Take 1 Tab by mouth daily.   ? HYDROcodone/acetaminophen (NORCO) 5/325 mg tablet Take 1 tablet by mouth every 4 hours as needed  for Pain   ? lansoprazole DR (PREVACID) 30 mg PO capsule Take 30 mg by mouth twice daily.   ? lidocaine (LIDODERM) 5 % topical patch Apply one patch topically to affected area every 24 hours. Apply patch for 12 hours, then remove for 12 hours before repeating.   ? methocarbamoL (ROBAXIN) 750 mg tablet Take one tablet by mouth three times daily as needed for Spasms.   ? metoprolol XL (TOPROL XL) 50 mg extended release tablet Take 50 mg by mouth daily.   ? ondansetron (ZOFRAN ODT) 4 mg rapid dissolve tablet Dissolve one tablet by mouth every 8 hours as needed for Nausea or Vomiting. Place on tongue to disolve.   ? oxyCODONE (ROXICODONE) 5 mg tablet Take one tablet by mouth every 6 hours as needed for Pain   ? simvastatin (ZOCOR) 40 mg PO tablet Take 40 mg by mouth at bedtime daily.     Review of Systems:  A 14 point review of systems was negative except for: abdominal pain, pain in rectum, nausea, blood in stool    Physical Exam:  Vital Signs: Last Filed In 24 Hours Vital Signs: 24 Hour Range   BP: 143/103 (02/13 1156)  Temp: 36.9 ?C (98.5 ?F) (02/13 1159)  Pulse: 75 (02/13 1328)  Respirations: 18 PER MINUTE (02/13 1328)  SpO2: 97 % (02/13 1328)  SpO2 Pulse: 75 (02/13 1328)  Height: 162.6 cm (5' 4) (02/13 1159) BP: (143)/(103)   Temp:  [36.9 ?C (98.5 ?F)]   Pulse:  [75-101]   Respirations:  [18 PER MINUTE-19 PER MINUTE]   SpO2:  [97 %-100 %]    Intensity Pain Scale (Self Report): 8 (04/19/20 1159)      General:  Alert, cooperative, no distress, appears stated age  Head:  Normocephalic, without obvious abnormality, atraumatic  Eyes:  Conjunctivae/corneas clear.  PERRL, EOMs intact.  Throat: Lips, mucosa and tongue normal.  Teeth and gums normal  Neck:    Supple, symmetrical, trachea midline, no adenopathy, thyroid: no enlargement/tenderness/nodules, no carotid bruit and no JVD  Back:  Symmetric, no curvature, ROM normal.  No CVA tenderness.  Lungs:  Clear to auscultation bilaterally  Chest wall:  No tenderness or deformity.  Heart:   Regular rate and rhythm, S1, S2 normal, no murmur, click rub or gallop  Abdomen:  Soft, non-tender.  Bowel sounds normal.  No masses.  No organomegaly.  Extremities: Extremities normal, atraumatic, no cyanosis or edema  Peripheral pulses   2+ and symmetric, all extremities  Skin: Skin color, texture, turgor normal.  No rashes or lesions  Neurologic:   CNII - XII intact.  Normal strength, sensation and reflexes throughout.  Musculoskeletal: Full ROM  Psych: Flat effect    Lab/Radiology/Other Diagnostic Tests:  24-hour labs:    Results for orders placed or performed during the hospital encounter of 04/19/20 (from the past 24 hour(s))   CBC AND DIFF    Collection Time: 04/19/20 12:53 PM   Result Value Ref Range    White Blood Cells 4.1 (L) 4.5 - 11.0 K/UL    RBC 3.75 (L) 4.0 - 5.0 M/UL    Hemoglobin 9.3 (L) 12.0 - 15.0 GM/DL    Hematocrit 16.1 (L) 36 - 45 %    MCV 76.2 (L) 80 - 100 FL    MCH 24.7 (L) 26 - 34 PG    MCHC 32.5 32.0 - 36.0 G/DL    RDW 09.6 (H) 11 - 15 %    Platelet Count 292 150 -  400 K/UL    MPV 7.7 7 - 11 FL    Neutrophils 78 (H) 41 - 77 %    Lymphocytes 10 (L) 24 - 44 %    Monocytes 8 4 - 12 %    Eosinophils 3 0 - 5 %    Basophils 1 0 - 2 %    Absolute Neutrophil Count 3.19 1.8 - 7.0 K/UL    Absolute Lymph Count 0.40 (L) 1.0 - 4.8 K/UL    Absolute Monocyte Count 0.32 0 - 0.80 K/UL    Absolute Eosinophil Count 0.12 0 - 0.45 K/UL    Absolute Basophil Count 0.03 0 - 0.20 K/UL    MDW (Monocyte Distribution Width) 15.2 <20.7   COMPREHENSIVE METABOLIC PANEL    Collection Time: 04/19/20 12:53 PM   Result Value Ref Range    Sodium 139 137 - 147 MMOL/L    Potassium 3.7 3.5 - 5.1 MMOL/L    Chloride 104 98 - 110 MMOL/L    Glucose 102 (H) 70 - 100 MG/DL    Blood Urea Nitrogen 11 7 - 25 MG/DL    Creatinine 1.61 0.4 - 1.00 MG/DL    Calcium 9.2 8.5 - 09.6 MG/DL    Total Protein 7.6 6.0 - 8.0 G/DL    Total Bilirubin 0.3 0.3 - 1.2 MG/DL    Albumin 4.3 3.5 - 5.0 G/DL    Alk Phosphatase 045 (H) 25 - 110 U/L    AST (SGOT) 11 7 - 40 U/L    CO2 25 21 - 30 MMOL/L    ALT (SGPT) 9 7 - 56 U/L    Anion Gap 10 3 - 12    eGFR >60 >60 mL/min   URINALYSIS DIPSTICK REFLEX TO CULTURE    Collection Time: 04/19/20 12:53 PM    Specimen: Urine   Result Value Ref Range    Color,UA YELLOW     Turbidity,UA CLEAR CLEAR-CLEAR    Specific Gravity-Urine 1.017 1.005 - 1.030    pH,UA 6.0 5.0 - 8.0    Protein,UA NEG NEG-NEG    Glucose,UA NEG NEG-NEG    Ketones,UA NEG NEG-NEG    Bilirubin,UA NEG NEG-NEG    Blood,UA NEG NEG-NEG    Urobilinogen,UA NORMAL NORM-NORMAL    Nitrite,UA NEG NEG-NEG    Leukocytes,UA NEG NEG-NEG    Urine Ascorbic Acid, UA NEG NEG-NEG   URINALYSIS MICROSCOPIC REFLEX TO CULTURE    Collection Time: 04/19/20 12:53 PM    Specimen: Urine   Result Value Ref Range    WBCs,UA 0-2 0 - 2 /HPF    RBCs,UA 0-2 0 - 3 /HPF    Comment,UA       Criteria for reflex to culture are WBC>10, Positive Nitrite, and/or >=+1   leukocytes. If quantity is not sufficient, an addendum will follow.      MucousUA TRACE     Squamous Epithelial Cells 0-2 0 - 5   C REACTIVE PROTEIN (CRP)    Collection Time: 04/19/20 12:53 PM   Result Value Ref Range    C-Reactive Protein 0.61 <1.0 MG/DL   POC LACTATE    Collection Time: 04/19/20 12:55 PM   Result Value Ref Range    LACTIC ACID POC 1.0 0.5 - 2.0 MMOL/L     Glucose: (!) 102 (04/19/20 1253)  Pertinent radiology reviewed.     CT abdomen/pelvis  1. ?No bowel obstruction, inflammatory mass, ascites, or free air.   2. ?Mild distal colonic diverticulosis.   3. ?Mild hepatosplenomegaly with  probable mild diffuse hepatic steatosis.     Michele Mcalpine, MD  Pager [phone]

## 2020-04-20 NOTE — Discharge Instructions - Pharmacy
Discharge Summary      Name: Tanya French  Medical Record Number: [id number]        Account Number:  [id number]  Date Of Birth:  04/14/1965                         Age:  54 years   Admit date:  04/19/2020                     Discharge date:  04/19/2020      Discharge Attending:  Noralee Stain  Discharge Summary Completed By: Michele Mcalpine, MD    Service: Med Private Swing 4 [phone]    Reason for hospitalization:  Abdominal pain [R10.9]    Primary Discharge Diagnosis:   Same as Above    Hospital Diagnoses:  Hospital Problems        Active Problems    * (Principal) Abdominal pain        Significant Past Medical History        Anemia  Anxiety  GERD (gastroesophageal reflux disease)  Hepatic steatosis  Hyperlipidemia  Periumbilical hernia  Rectal ulcer  Rectovaginal fistula      Comment:  s/p flap in June 2014  Sigmoid diverticulosis    Allergies   Toradol [ketorolac], Keflex [cephalexin], Compazine [prochlorperazine], and Iodinated contrast media    Brief Hospital Course   55 year-old female with PMH of 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 She is not following with GI as outpatient, managed by PCP in [place]. Stated that is taking sulfasalazine. Last colonoscopy at [place] in 2018 with multiple ulcerations. Biopsy with ulceration with fibrinopurulent exudate, focally hyalinized lamina   propria, and surface hyperplastic changes. Per Care everywhere review patient had very frequent visits to ED in multiple hospitals with same complaints and frequently leaving AMA. Last was seen in [place] week ago. No significant abnormalities found. On presentation hemodynamically stable, no leukocytosis, Hgb 9.3 which is her baseline. CRP normal. CT scan with no bowel obstruction, inflammatory mass, ascites, or free air. Mild distal colonic diverticulosis. Mild hepatosplenomegaly with probable mild diffuse hepatic steatosis. ED requested admit for pain control and GI evaluation. Consult was placed. Patient was started on clear liquids, antiemetics, pain control with bentyl ,lidoderm patch, oxycodone PRN. There was concern for drug seeking behavior as patient stated that she will not stay if will not get IV opioids immediately and refused to wait for GI evaluation. She signed AMA papers and left in stable condition.       Items Needing Follow Up   Pending items or areas that need to be addressed at follow up: none    Pending Labs and Follow Up Radiology    Pending labs and/or radiology review at this time of discharge are listed below: if this area is blank, there are no items for review.   Pending Labs     Order Current Status    UA REFLEX LABEL In process            Medications      Medication List      CONTINUE taking these medications    ? ALPRAZolam XR 2 mg tablet; Commonly known as: XANAX XR; Dose: 2 mg;   Refills: 0  ? dicyclomine 20 mg tablet; Commonly known as: BENTYL;  Dose: 20 mg; Take 1   Tab by mouth every 6 hours.; Quantity: 20 Tab; Refills: 0  ? duloxetine DR 60 mg capsule; Commonly known as: CYMBALTA; Dose: 120 mg;   Refills: 0  ? eszopiclone 3 mg tablet; Commonly known as: LUNESTA; Dose: 3 mg;   Refills: 0  ? ferrous sulfate 325 mg (65 mg iron) tablet; Commonly known as: FEOSOL;   Dose: 325 mg; Take 1 Tab by mouth daily.; Quantity: 30 Tab; Refills: 3  ? lidocaine 5 % topical patch; Commonly known as: LIDODERM; Dose: 1 patch;   Apply one patch topically to affected area every 24 hours. Apply patch for   12 hours, then remove for 12 hours before repeating.; Quantity: 30 patch;   Refills: 0  ? methocarbamoL 750 mg tablet; Commonly known as: ROBAXIN; Dose: 750 mg;   Take one tablet by mouth three times daily as needed for Spasms.;   Quantity: 15 tablet; Refills: 0  ? metoprolol XL 50 mg extended release tablet; Commonly known as: TOPROL   XL; Dose: 50 mg; Refills: 0  ? ondansetron 4 mg rapid dissolve tablet; Commonly known as: ZOFRAN ODT;   Dose: 4 mg; Dissolve one tablet by mouth every 8 hours as needed for   Nausea or Vomiting. Place on tongue to disolve.; Quantity: 10 tablet;   Refills: 0  ? PREVACID 30 mg capsule; Generic drug: lansoprazole DR; Dose: 30 mg;   Refills: 0  ? simvastatin 40 mg tablet; Commonly known as: ZOCOR; Dose: 40 mg;   Refills: 0     STOP taking these medications    ? azithromycin 250 mg tablet; Commonly known as: ZITHROMAX  ? HYDROcodone/acetaminophen 5/325 mg tablet; Commonly known as: NORCO  ? oxyCODONE 5 mg tablet; Commonly known as: ROXICODONE       Return Appointments and Scheduled Appointments      Left AMA, no appointment was made    Consults, Procedures, Diagnostics, Micro, Pathology   Consults: None  Surgical Procedures & Dates: None  Significant Diagnostic Studies, Micro and Procedures: noted in brief hospital course  Significant Pathology: none  Nutrition: No Dietitian Consult    Discharge Disposition, Condition   Patient Disposition: Left AMA  Condition at Discharge: Stable    Code Status     Code Status History     Date Active Date Inactive Code Status Order ID          05/29/2019 [id number] 05/29/2019 1020 Full Code [id number]  [name], MD Inpatient    08/04/2016 1419 08/05/2016 1902 Full Code [id number]  [name], MD Inpatient    Only showing the last 2 code statuses.          Patient Instructions        Regular Diet    You have no dietary restriction. Please continue with a healthy balanced diet.     Report These Signs and Symptoms    Patient left AMA, no instructions were given     Questions About Your Stay    For questions or concerns regarding your hospital stay, call [phone].     Discharging attending physician: Michele Mcalpine [[id number]]      Activity as Tolerated    It is important to keep increasing your activity level after you leave the hospital.  Moving around can help prevent blood clots, lung infection (pneumonia) and other problems.  Gradually increasing the number of times you are up moving around will help you return to your normal activity  level more quickly.  Continue to increase the number of times you are up to the chair and walking daily to return to your normal activity level. Begin to work toward your normal activity level at discharge     Return Appointment    Left AMA, no appointment made       Additional Orders: Case Management, Supplies, Home Health     Home Health/DME     None            Signed:  Michele Mcalpine, MD  04/19/2020      cc:  Primary Care Physician:  No Pcp, Na   PCP Unknown    Referring physicians:  No ref. provider found   Additional provider(s):        Did we miss something? If additional records are needed, please fax a request on office letterhead to [phone]. Please include the patient's name, date of birth, fax number and type of information needed. Additional request can be made by email at [email or web address]. For general questions of information about electronic records sharing, call [phone].

## 2020-04-20 NOTE — Progress Notes
Patient refused oral pain medication. Provider notified. Patient opted to leave AMA, education completed, provider notified.     Patient left the unit at 1900.

## 2020-05-03 ENCOUNTER — Encounter: Admit: 2020-05-03 | Discharge: 2020-05-03 | Payer: PRIVATE HEALTH INSURANCE

## 2020-05-03 ENCOUNTER — Emergency Department: Admit: 2020-05-03 | Discharge: 2020-05-03 | Payer: PRIVATE HEALTH INSURANCE

## 2020-05-03 DIAGNOSIS — R1032 Left lower quadrant pain: Secondary | ICD-10-CM

## 2020-05-03 LAB — URINALYSIS DIPSTICK REFLEX TO CULTURE
Lab: NEGATIVE K/UL (ref 0–0.80)
Lab: NEGATIVE K/UL (ref 3–12)
Lab: NEGATIVE U/L (ref 7–40)
Lab: NEGATIVE U/L — ABNORMAL LOW (ref 25–110)
Lab: NEGATIVE g/dL (ref 3.5–5.0)
Lab: NEGATIVE mL/min — ABNORMAL LOW (ref 1.0–4.8)

## 2020-05-03 LAB — CBC AND DIFF
Lab: 0 K/UL (ref 0–0.20)
Lab: 0.1 K/UL (ref 0–0.45)
Lab: 15 (ref <20.7)
Lab: 4 K/UL — ABNORMAL LOW (ref 4.5–11.0)

## 2020-05-03 LAB — COMPREHENSIVE METABOLIC PANEL
Lab: 102 MMOL/L — ABNORMAL LOW (ref 98–110)
Lab: 140 MMOL/L — ABNORMAL LOW (ref 137–147)
Lab: 4.2 MMOL/L — ABNORMAL LOW (ref 3.5–5.1)

## 2020-05-03 LAB — PROTIME INR (PT)
Lab: 1 mg/dL (ref 0.8–1.2)
Lab: 10 s (ref 8.5–14.4)

## 2020-05-03 LAB — URINALYSIS MICROSCOPIC REFLEX TO CULTURE

## 2020-05-03 LAB — LIPASE: Lab: 27 U/L — ABNORMAL HIGH (ref 11–82)

## 2020-05-03 LAB — POC LACTATE: Lab: 1.7 MMOL/L (ref 0.5–2.0)

## 2020-05-03 MED ORDER — SODIUM CHLORIDE 0.9 % IV SOLP
1000 mL | Freq: Once | INTRAVENOUS | 0 refills | Status: CP
Start: 2020-05-03 — End: ?
  Administered 2020-05-03: 17:00:00 1000 mL via INTRAVENOUS

## 2020-05-03 MED ORDER — IOHEXOL 350 MG IODINE/ML IV SOLN
100 mL | Freq: Once | INTRAVENOUS | 0 refills | Status: CP
Start: 2020-05-03 — End: ?
  Administered 2020-05-03: 16:00:00 100 mL via INTRAVENOUS

## 2020-05-03 MED ORDER — SODIUM CHLORIDE 0.9 % IV SOLP
1000 mL | INTRAVENOUS | 0 refills | Status: CP
Start: 2020-05-03 — End: ?
  Administered 2020-05-03: 16:00:00 1000 mL via INTRAVENOUS

## 2020-05-03 MED ORDER — SODIUM CHLORIDE 0.9 % IJ SOLN
50 mL | Freq: Once | INTRAVENOUS | 0 refills | Status: CP
Start: 2020-05-03 — End: ?
  Administered 2020-05-03: 16:00:00 50 mL via INTRAVENOUS

## 2020-05-03 MED ORDER — ONDANSETRON HCL (PF) 4 MG/2 ML IJ SOLN
4 mg | INTRAVENOUS | 0 refills | Status: DC | PRN
Start: 2020-05-03 — End: 2020-05-03
  Administered 2020-05-03: 16:00:00 4 mg via INTRAVENOUS

## 2020-05-03 MED ORDER — MORPHINE 2 MG/ML IV SYRG
2-4 mg | INTRAVENOUS | 0 refills | Status: DC | PRN
Start: 2020-05-03 — End: 2020-05-03
  Administered 2020-05-03: 16:00:00 4 mg via INTRAVENOUS

## 2020-05-03 MED ORDER — HYDROCODONE-ACETAMINOPHEN 5-325 MG PO TAB
1 | Freq: Once | ORAL | 0 refills | Status: DC
Start: 2020-05-03 — End: 2020-05-03

## 2020-05-03 MED ORDER — OXYCODONE 10 MG PO TAB
10 mg | Freq: Once | ORAL | 0 refills | Status: CP
Start: 2020-05-03 — End: ?
  Administered 2020-05-03: 19:00:00 10 mg via ORAL

## 2020-05-03 MED ORDER — PANTOPRAZOLE 40 MG IV SOLR
40 mg | Freq: Once | INTRAVENOUS | 0 refills | Status: CP
Start: 2020-05-03 — End: ?
  Administered 2020-05-03: 16:00:00 40 mg via INTRAVENOUS

## 2020-05-03 NOTE — ED Notes
Patient educated and provided discharge instructions, home care, and follow up care. Patient verbalized understanding and had all questions answered by RN at this time. Pt is accompanied out by herself, with a steady gait and even and unlabored breathing.     Patient left with all belongings.

## 2020-05-03 NOTE — ED Notes
Report given to Sidney, RN.

## 2020-05-31 ENCOUNTER — Encounter: Admit: 2020-05-31 | Discharge: 2020-05-31 | Payer: PRIVATE HEALTH INSURANCE

## 2020-05-31 ENCOUNTER — Emergency Department: Admit: 2020-05-31 | Discharge: 2020-05-31 | Discharge status: Against Medical Advice | Payer: PRIVATE HEALTH INSURANCE

## 2020-05-31 DIAGNOSIS — R1013 Epigastric pain: Secondary | ICD-10-CM

## 2020-05-31 LAB — POC LACTATE: Lab: 2.3 MMOL/L — ABNORMAL HIGH (ref 0.5–2.0)

## 2020-05-31 LAB — POC TROPONIN: Lab: 0 ng/mL (ref 0.00–0.05)

## 2020-05-31 LAB — POC CREATININE, RAD: Lab: 0.6 mg/dL (ref 0.4–1.00)

## 2020-05-31 NOTE — ED Provider Notes
Tanya French is a 55 y.o. female.    Chief Complaint:  Chief Complaint   Patient presents with   ? Epigastric Pain     8/10, sharp, constant since 0200 this AM; nausea, vomiting; took 4mg  zofran this AM;        History of Present Illness:  Tanya French is a 55 y.o. female, with a history of anemia, anxiety, GERD, hepatic steatosis, hyperlipidemia, hysterectomy, rectovaginal fistula, and sigmoid diverticulosis, who presents to the emergency department for epigastric pain. Pt reports epigastric pain that began today at hour 0200. Pt reports pain is a short stabbing pain in LUQ that radiates into her back. Pt reports associated n/v with pain. Pt reports taking Zofran with slight relief of her nausea. Pt denies eating/drinking anything abnormal prior to pain. Pt endorses up dosing hydrocodone rx last night with no relief. Pt denies experiencing similar symptoms in the past. Pt denies SOA, fever, chills, cough, diarrhea, constipation. Denies hx of heart problems.       History provided by:  Patient  Language interpreter used: No        Review of Systems:  Review of Systems   Constitutional: Negative for fever.   HENT: Negative for sore throat.    Eyes: Negative for visual disturbance.   Respiratory: Negative for shortness of breath.    Cardiovascular: Negative for chest pain.   Gastrointestinal: Positive for abdominal pain, nausea and vomiting.   Genitourinary: Negative for dysuria.   Skin: Negative for rash.   Neurological: Negative for headaches.       Allergies:  Toradol [ketorolac], Keflex [cephalexin], Compazine [prochlorperazine], and Iodinated contrast media    Past Medical History:  Medical History:   Diagnosis Date   ? Anemia    ? Anxiety    ? GERD (gastroesophageal reflux disease)    ? Hepatic steatosis    ? Hyperlipidemia    ? Periumbilical hernia    ? Rectal ulcer    ? 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 Sigmoid diverticulosis        Past Surgical History:  Surgical History:   Procedure Laterality Date   ? ACL RECONSTRUCTION  2008   ? HYSTERECTOMY  2009    R oophorectomy   ? APPENDECTOMY  2009   ? CHOLECYSTECTOMY  2009   ? OOPHORECTOMY  2010    L oophorectomy   ? CARPAL TUNNEL RELEASE  2012   ? SIGMOIDOSCOPY DIAGNOSTIC N/A 03/20/2015    Performed by Tim Lair, MD at [facility] ENDO   ? SIGMOIDOSCOPY BIOPSY  03/20/2015    Performed by Tim Lair, MD at [facility] ENDO   ? COLONOSCOPY N/A 08/05/2016    Performed by Tommie Sams, MD at [facility] ENDO   ? COLONOSCOPY BIOPSY  08/05/2016    Performed by Tommie Sams, MD at [facility] ENDO   ? PLANTAR FASCIA SURGERY         Pertinent medical/surgical history reviewed  Medical History:   Diagnosis Date   ? Anemia    ? Anxiety    ? GERD (gastroesophageal reflux disease)    ? Hepatic steatosis    ? Hyperlipidemia    ? Periumbilical hernia    ? Rectal ulcer    ? 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 Sigmoid diverticulosis      Surgical History:   Procedure Laterality Date   ? ACL RECONSTRUCTION  2008   ?  HYSTERECTOMY  2009    R oophorectomy   ? APPENDECTOMY  2009   ? CHOLECYSTECTOMY  2009   ? OOPHORECTOMY  2010    L oophorectomy   ? CARPAL TUNNEL RELEASE  2012   ? SIGMOIDOSCOPY DIAGNOSTIC N/A 03/20/2015    Performed by Tim Lair, MD at [facility] ENDO   ? SIGMOIDOSCOPY BIOPSY  03/20/2015    Performed by Tim Lair, MD at [facility] ENDO   ? COLONOSCOPY N/A 08/05/2016    Performed by Tommie Sams, MD at [facility] ENDO   ? COLONOSCOPY BIOPSY  08/05/2016    Performed by Tommie Sams, MD at [facility] ENDO   ? PLANTAR FASCIA SURGERY         Social History:  Social History     Tobacco Use   ? Smoking status: Former Smoker     Packs/day: 0.50     Years: 20.00     Pack years: 10.00     Types: Cigarettes   ? Smokeless tobacco: Never Used   Vaping Use   ? Vaping Use: Never used   Substance Use Topics   ? Alcohol use: Yes     Comment: social   ? Drug use: No     Social History     Substance and Sexual Activity   Drug Use No             Family History:  Family History   Problem Relation Age of Onset   ? Cancer Maternal Grandmother         colon CA at age 51   ? Diabetes Father    ? Heart Attack Father    ? Hypertension Father    ? Other Father         arrhythmias   ? Hypertension Mother    ? Other Mother         COPD       Vitals:  ED Vitals    Date and Time T BP P RR SPO2P SPO2 User   05/31/20 1130 -- 141/105 95 12 PER MINUTE 94 100 % CL   05/31/20 1107 -- 136/93 95 13 PER MINUTE 95 99 % CL   05/31/20 1039 36.8 ?C (98.2 ?F) 162/106 100 18 PER MINUTE -- 98 % LH          Physical Exam:  Physical Exam  Vitals and nursing note reviewed.   Constitutional:       General: She is not in acute distress.     Appearance: Normal appearance. She is obese. She is not ill-appearing.   HENT:      Head: Normocephalic and atraumatic.      Right Ear: External ear normal.      Left Ear: External ear normal.      Nose: Nose normal.      Mouth/Throat:      Mouth: Mucous membranes are moist.   Eyes:      Extraocular Movements: Extraocular movements intact.      Conjunctiva/sclera: Conjunctivae normal.   Cardiovascular:      Rate and Rhythm: Normal rate and regular rhythm.      Pulses: Normal pulses.      Heart sounds: Normal heart sounds.   Pulmonary:      Effort: Pulmonary effort is normal. No respiratory distress.      Breath sounds: Normal breath sounds. No wheezing.   Chest:      Chest wall: No tenderness.   Abdominal:  General: Bowel sounds are normal. There is no distension.      Palpations: Abdomen is soft.      Tenderness: There is abdominal tenderness in the left upper quadrant.   Musculoskeletal:         General: Normal range of motion.      Cervical back: Neck supple.      Right lower leg: No edema.      Left lower leg: No edema.   Skin:     General: Skin is warm and dry.      Capillary Refill: Capillary refill takes less than 2 seconds.   Neurological:      General: No focal deficit present.      Mental Status: She is oriented to person, place, and time. Mental status is at baseline.   Psychiatric:         Mood and Affect: Mood normal.         Behavior: Behavior normal.         Laboratory Results:  Labs Reviewed   POC LACTATE - Abnormal       Result Value Ref Range Status    LACTIC ACID POC 2.3 (*) 0.5 - 2.0 MMOL/L Final   POC CREATININE, RAD    Creatinine, POC 0.6  0.4 - 1.00 MG/DL Final   POC TROPONIN    Troponin-I-POC 0.01  0.00 - 0.05 NG/ML Final   MINT TOP TUBE   LAVENDER TOP TUBE          Radiology Interpretation:    No orders to display     EKG:  HR 105, PR 161, QTc 446, sinus tachycardia, no STEMI, similar to previous.    ED Course:  Tanya French is a 55 y.o. female with history as above presenting for complaint of abdominal pain with nausea and vomiting. IV access was established and patient was placed on telemetry monitoring. Vital signs and physical exam as documented above and largely unremarkable with the exception of epigastric tenderness with otherwise soft/benign abdomen. No nausea or vomiting in the ER.    Differential diagnosis at this time includes but is not limited to pancreatitis, ACS, GERD.     EKG nonischemic. During evaluation, patient was offered tylenol/GI cocktail/IV fluids while waiting for laboratory/imaging evaluation. During that time she refused these medications and asked for IV opiates. Patient told that if there were concern for acute pancreatitis or other concerning etiology, that she would be appropriately treated. Chart review elicited history of concern for drug seeking behavior which was also concerning on today's evaluation    Approximately 5 minutes after evaluation, was contacted from the RN and told that the patient had dressed and walked out of the ER.     Troponin was found to be 0 with nonischemic EKG. Unfortunately workup was unable to be finished at this time and patient received no discharge instructions or return precautions.         MDM  Reviewed: previous chart, nursing note and vitals  Interpretation: labs      Facility Administered Meds:  Medications - No data to display      Clinical Impression:  Clinical Impression   Abdominal pain, epigastric       Disposition/Follow up  ED Disposition     ED Disposition    LBTC        Emergency Department: [facility]  [address]  [address]  [place] [address]  [phone]    As needed,  If symptoms worsen      Medications:  New Prescriptions    No medications on file       Procedure Notes:  Procedures        Attestation / Supervision:  I, Colletta Maryland, am scribing for and in the presence of Shelby Mattocks, MD.    Colletta Maryland      I, Stefani Dama, MD, personally performed the services described in this documentation as scribed and it is both accurate and complete.    Stefani Dama, MD  Emergency Medicine, PGY-2  [facility]  05/31/2020

## 2020-05-31 NOTE — ED Notes
54y F ambulated to ED 35 for c/o mid epigastric pain 8/10, sharp, constant since 0200 this AM. Patient reports nausea and vomiting, took 4mg  zofran 0730, relieved nausea. Patient's father had MI at 62. Patient has no hx of cardiac problems. Denies hypertension. Hypertensive here, 162/106. Hx of   Rectal bleeding   Anxiety   Solitary rectal ulcer   Depression   Dyslipidemia   Gastroesophageal reflux disease without esophagitis   Rectal pain   Tobacco dependence   Proctitis   Abdominal pain   Acute blood loss anemia   Sepsis (HCC)   Acute pain   Abdominal trauma     AOx4. EKG obtained on arrival. Patient resting on cart, locked in lowest position, patient placed on cardiac monitor. Patient retains all belongings:  57  Black pants  Black socks  Black tennis shoes  Tanya French

## 2020-06-23 ENCOUNTER — Encounter: Admit: 2020-06-23 | Discharge: 2020-06-23 | Payer: PRIVATE HEALTH INSURANCE

## 2020-06-23 ENCOUNTER — Emergency Department
Admit: 2020-06-23 | Discharge: 2020-06-23 | Disposition: A | Discharge status: Home / Self Care | Payer: PRIVATE HEALTH INSURANCE

## 2020-06-23 LAB — COMPREHENSIVE METABOLIC PANEL
Lab: 140 MMOL/L (ref 137–147)
Lab: >60 mL/min — ABNORMAL LOW (ref >60)

## 2020-06-23 LAB — POC LACTATE: Lab: 1.6 MMOL/L (ref 0.5–2.0)

## 2020-06-23 LAB — URINALYSIS MICROSCOPIC REFLEX TO CULTURE

## 2020-06-23 LAB — URINALYSIS DIPSTICK REFLEX TO CULTURE
Lab: NEGATIVE K/UL (ref 3–12)
Lab: NEGATIVE MMOL/L (ref 21–30)
Lab: NEGATIVE U/L (ref 7–56)
Lab: NEGATIVE g/dL (ref 6.0–8.0)
Lab: NEGATIVE g/dL — ABNORMAL HIGH (ref 3.5–5.0)
Lab: NEGATIVE mg/dL (ref 0.3–1.2)

## 2020-06-23 LAB — CBC AND DIFF
Lab: 0 K/UL (ref 0–0.20)
Lab: 0.1 K/UL (ref 0–0.45)
Lab: 0.3 K/UL (ref 0–0.80)
Lab: 16 (ref <20.7)
Lab: 5.1 K/UL (ref 4.5–11.0)

## 2020-06-23 LAB — LIPASE: Lab: 27 U/L — ABNORMAL LOW (ref 11–82)

## 2020-06-23 MED ORDER — MORPHINE 4 MG/ML IV SYRG
6 mg | Freq: Once | INTRAVENOUS | 0 refills | Status: DC
Start: 2020-06-23 — End: 2020-06-23

## 2020-06-23 MED ORDER — LACTATED RINGERS IV SOLP
1000 mL | INTRAVENOUS | 0 refills | Status: DC
Start: 2020-06-23 — End: 2020-06-23

## 2020-06-23 NOTE — ED Notes
Pt left AMA once told she was not going to receive IV pain medication, USPIV d/c'd and pt ambulated out of department with steady gait. All belongings with pt at time of d/c.

## 2020-08-09 ENCOUNTER — Emergency Department: Admit: 2020-08-09 | Discharge: 2020-08-09 | Discharge status: Against Medical Advice | Payer: PRIVATE HEALTH INSURANCE

## 2020-08-09 ENCOUNTER — Encounter: Admit: 2020-08-09 | Discharge: 2020-08-09 | Payer: PRIVATE HEALTH INSURANCE

## 2020-08-09 DIAGNOSIS — F6810 Factitious disorder, unspecified: Secondary | ICD-10-CM

## 2020-08-09 NOTE — ED Notes
P/t currently taking off monitoring devices and stating " This isn't going to work, I'm leaving".  P/t is ambulating w/ steady gait, no distress noted.  P/t exited ER w/ steady gait AMA.  Care relinquished.
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
## 2020-09-13 ENCOUNTER — Encounter: Admit: 2020-09-13 | Discharge: 2020-09-13 | Payer: PRIVATE HEALTH INSURANCE

## 2020-09-13 ENCOUNTER — Emergency Department: Admit: 2020-09-13 | Discharge: 2020-09-13 | Discharge status: Against Medical Advice | Payer: PRIVATE HEALTH INSURANCE

## 2020-09-13 DIAGNOSIS — R1032 Left lower quadrant pain: Secondary | ICD-10-CM

## 2020-09-13 DIAGNOSIS — D649 Anemia, unspecified: Secondary | ICD-10-CM

## 2020-09-13 DIAGNOSIS — F6810 Factitious disorder, unspecified: Secondary | ICD-10-CM

## 2020-09-13 LAB — URINALYSIS MICROSCOPIC REFLEX TO CULTURE

## 2020-09-13 LAB — URINALYSIS DIPSTICK REFLEX TO CULTURE
GLUCOSE,UA: NEGATIVE g/dL (ref 6.0–8.0)
LEUKOCYTES: NEGATIVE U/L (ref 7–56)
NITRITE: NEGATIVE MMOL/L (ref 21–30)
PROTEIN,UA: NEGATIVE mg/dL — ABNORMAL HIGH (ref 8.5–10.6)
URINE ASCORBIC ACID, UA: NEGATIVE K/UL (ref 3–12)
URINE BILE: NEGATIVE g/dL — ABNORMAL HIGH (ref 3.5–5.0)
URINE BLOOD: NEGATIVE U/L — ABNORMAL LOW (ref 25–110)
URINE KETONE: NEGATIVE mg/dL (ref 0.3–1.2)

## 2020-09-13 LAB — CBC AND DIFF
ABSOLUTE BASO COUNT: 0 K/UL (ref 0–0.20)
ABSOLUTE EOS COUNT: 0 K/UL (ref 0–0.45)
ABSOLUTE MONO COUNT: 0.3 K/UL (ref 0–0.80)
MDW (MONOCYTE DISTRIBUTION WIDTH): 16 (ref <20.7)
WBC COUNT: 6.5 K/UL (ref 4.5–11.0)

## 2020-09-13 LAB — COMPREHENSIVE METABOLIC PANEL
EGFR: >60 mL/min — ABNORMAL LOW (ref >60)
SODIUM: 138 MMOL/L — ABNORMAL LOW (ref 137–147)

## 2020-09-13 LAB — LIPASE: LIPASE: 71 U/L — ABNORMAL LOW (ref 11–82)

## 2020-09-13 LAB — POC LACTATE: LACTIC ACID POC: 1.4 MMOL/L (ref 0.5–2.0)

## 2020-09-13 MED ORDER — ACETAMINOPHEN 500 MG PO TAB
1000 mg | Freq: Once | ORAL | 0 refills | Status: DC
Start: 2020-09-13 — End: 2020-09-13

## 2020-09-13 MED ORDER — ONDANSETRON HCL (PF) 4 MG/2 ML IJ SOLN
8 mg | Freq: Once | INTRAVENOUS | 0 refills | Status: DC
Start: 2020-09-13 — End: 2020-09-13

## 2020-09-13 NOTE — ED Notes
Pt seen ambulating out of ED triage area to parking lot with steady gait.

## 2020-09-13 NOTE — ED Notes
Pt presents to the ED with complaints of LLQ abdominal pain that started 2 days ago. Pt reports nausea, vomiting, and diarrhea as well. Pt has a hx of diverticulitis and reports this pain feels similar to her past flare ups. Pt denies fever/chills, cough, SOA, CP, LOC, falls, or urinary issues. A&Ox4. Skin warm, dry, intact. Resp even and unlabored. Pt attached to all monitors. Pt resting comfortably. Call light within reach. Pt verbalized understanding of use. Cart in lowest position, wheels are locked, and bed rails up x2.

## 2020-09-13 NOTE — ED Provider Notes
Tanya French is a 55 y.o. female.    Chief Complaint:  Chief Complaint   Patient presents with   ? Abdominal pain     LLQ pain along with nausea/vomiting since Friday; hx diverticulitis.       History of Present Illness:  Tanya French is a 55 y.o. female, has a past medical history of Anemia, Hepatic steatosis, Hyperlipidemia, Periumbilical hernia, Rectal ulcer and Sigmoid diverticulosis. who presents to the emergency department for abdominal pain. Patient reports progressively worsening LLQ abdominal pain for 2x days w/ new nausea, vomiting and diarrhea. Abdominal pain is constant, sharp and stabbing like. Patient reports her pain is as severe as child birth like pains. Patient notes her stool is loose and watery like w/ intermittent bright red bloody streaks. Patient endorses h/o C. diff (May 2022) and diverticulitis. Notes her symptoms are similar to previous diverticulitis episodes. Denies recent antibiotics course. shx cholecystectomy, appendectomy, complete hysterectomy and complete oophorectomy. Denies sick contacts. Patient denies fever, chills, cough, congestion, SOB, pain w/ urination and CP.       History provided by:  Medical records and patient  Language interpreter used: No        Review of Systems:  Review of Systems   Constitutional: Negative for fever.   HENT: Negative for sore throat.    Eyes: Negative for visual disturbance.   Respiratory: Negative for shortness of breath.    Cardiovascular: Negative for chest pain.   Gastrointestinal: Positive for abdominal pain, blood in stool, diarrhea, nausea and vomiting.   Genitourinary: Negative for dysuria.   Musculoskeletal: Negative for back pain.   Skin: Negative for rash.   Allergic/Immunologic: Negative for immunocompromised state.   Neurological: Negative for headaches.   Hematological: Does not bruise/bleed easily.   Psychiatric/Behavioral: Negative for confusion.   All other systems reviewed and are negative.      Allergies:  Toradol [ketorolac], Keflex [cephalexin], and Compazine [prochlorperazine]    Past Medical History:  Medical History:   Diagnosis Date   ? Anemia    ? Anxiety    ? GERD (gastroesophageal reflux disease)    ? Hepatic steatosis    ? Hyperlipidemia    ? Periumbilical hernia    ? Rectal ulcer    ? 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 Sigmoid diverticulosis        Past Surgical History:  Surgical History:   Procedure Laterality Date   ? ACL RECONSTRUCTION  2008   ? HYSTERECTOMY  2009    R oophorectomy   ? APPENDECTOMY  2009   ? CHOLECYSTECTOMY  2009   ? OOPHORECTOMY  2010    L oophorectomy   ? CARPAL TUNNEL RELEASE  2012   ? SIGMOIDOSCOPY DIAGNOSTIC N/A 03/20/2015    Performed by Tim Lair, MD at [facility] ENDO   ? SIGMOIDOSCOPY BIOPSY  03/20/2015    Per[facility] by [facility] Lair, [facility] at [facility] ENDO   ? COLONOSCOPY N/A 08/05/2016    Performed by Tommie Sams, MD at [facility] ENDO   ? COLONOSCOPY BIOPSY  08/05/2016    Performed by Tommie Sams, MD at [facility] ENDO   ? PLANTAR FASCIA SURGERY         Pertinent medical/surgical history reviewed  Medical History:   Diagnosis Date   ? Anemia    ? Anxiety    ? GERD (gastroesophageal reflux disease)    ? Hepatic steatosis    ? Hyperlipidemia    ?  Periumbilical hernia    ? Rectal ulcer    ? 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 Sigmoid diverticulosis      Surgical History:   Procedure Laterality Date   ? ACL RECONSTRUCTION  2008   ? HYSTERECTOMY  2009    R oophorectomy   ? APPENDECTOMY  2009   ? CHOLECYSTECTOMY  2009   ? OOPHORECTOMY  2010    L oophorectomy   ? CARPAL TUNNEL RELEASE  2012   ? SIGMOIDOSCOPY DIAGNOSTIC N/A 03/20/2015    Performed by Tim Lair, MD at [facility] ENDO   ? SIGMOIDOSCOPY BIOPSY  03/20/2015    Performed by Tim Lair, MD at [facility] ENDO   ? COLONOSCOPY N/A 08/05/2016    Performed by Tommie Sams, MD at [facility] ENDO   ? COLONOSCOPY BIOPSY  08/05/2016    Performed by Tommie Sams, MD at [facility] ENDO   ? PLANTAR FASCIA SURGERY         Social History:  Social History     Tobacco Use   ? Smoking status: Former Smoker     Packs/day: 0.50     Years: 20.00     Pack years: 10.00     Types: Cigarettes   ? Smokeless tobacco: Never Used   Vaping Use   ? Vaping Use: Never used   Substance Use Topics   ? Alcohol use: Yes     Comment: social   ? Drug use: No     Social History     Substance and Sexual Activity   Drug Use No             Family History:  Family History   Problem Relation Age of Onset   ? Cancer Maternal Grandmother         colon CA at age 69   ? Diabetes Father    ? Heart Attack Father    ? Hypertension Father    ? Other Father         arrhythmias   ? Hypertension Mother    ? Other Mother         COPD       Vitals:  ED Vitals    Date and Time T BP P RR SPO2P SPO2 User   09/13/20 1100 -- 165/121 93 18 PER MINUTE 92 100 % AC   09/13/20 1030 -- 171/119 98 23 PER MINUTE 98 97 % AC   09/13/20 0759 37 ?C (98.6 ?F) 155/100 107 -- -- 99 % SM          Physical Exam:  Physical Exam  Vitals and nursing note reviewed. Exam conducted with a chaperone present (S. Abad).   Constitutional:       General: She is not in acute distress.     Appearance: Normal appearance. She is not diaphoretic.   HENT:      Head: Normocephalic and atraumatic.      Right Ear: External ear normal. There is no impacted cerumen.      Left Ear: External ear normal. There is no impacted cerumen.      Mouth/Throat:      Mouth: Mucous membranes are moist.      Pharynx: Oropharynx is clear.   Eyes:      Conjunctiva/sclera: Conjunctivae normal.   Cardiovascular:      Rate and Rhythm: Regular rhythm. Tachycardia present.      Heart sounds: Normal heart sounds. No murmur  heard.  Pulmonary:      Effort: Pulmonary effort is normal. No respiratory distress.      Breath sounds: Normal breath sounds. No wheezing, rhonchi or rales.   Abdominal:      General: Abdomen is flat.      Palpations: Abdomen is soft. There is no mass.      Tenderness: There is abdominal tenderness in the left lower quadrant. There is no right CVA tenderness, left CVA tenderness, guarding or rebound.   Genitourinary:     Rectum: Guaiac result negative.      Comments: No gross blood appreciated during digital rectal exam. No obvious external hemorrhoids, anal fissures appreciated.   Musculoskeletal:         General: No deformity.      Cervical back: Neck supple.   Skin:     General: Skin is warm and dry.   Neurological:      General: No focal deficit present.      Mental Status: She is alert and oriented to person, place, and time.   Psychiatric:         Mood and Affect: Mood normal.         Behavior: Behavior normal.         Laboratory Results:  Labs Reviewed   CBC AND DIFF - Abnormal       Result Value Ref Range Status    White Blood Cells 6.5  4.5 - 11.0 K/UL Final    RBC 3.36 (*) 4.0 - 5.0 M/UL Final    Hemoglobin 7.5 (*) 12.0 - 15.0 GM/DL Final    Hematocrit 16.1 (*) 36 - 45 % Final    MCV 74.0 (*) 80 - 100 FL Final    MCH 22.4 (*) 26 - 34 PG Final    MCHC 30.3 (*) 32.0 - 36.0 G/DL Final    RDW 09.6 (*) 11 - 15 % Final    Platelet Count 266  150 - 400 K/UL Final    MPV 7.6  7 - 11 FL Final    Neutrophils 88 (*) 41 - 77 % Final    Lymphocytes 6 (*) 24 - 44 % Final    Monocytes 5  4 - 12 % Final    Eosinophils 1  0 - 5 % Final    Basophils 0  0 - 2 % Final    Absolute Neutrophil Count 5.70  1.8 - 7.0 K/UL Final    Absolute Lymph Count 0.39 (*) 1.0 - 4.8 K/UL Final    Absolute Monocyte Count 0.32  0 - 0.80 K/UL Final    Absolute Eosinophil Count 0.09  0 - 0.45 K/UL Final    Absolute Basophil Count 0.02  0 - 0.20 K/UL Final    MDW (Monocyte Distribution Width) 16.5  <20.7 Final   COMPREHENSIVE METABOLIC PANEL - Abnormal    Sodium 138  137 - 147 MMOL/L Final    Potassium 3.5  3.5 - 5.1 MMOL/L Final    Chloride 106  98 - 110 MMOL/L Final    Glucose 183 (*) 70 - 100 MG/DL Final    Blood Urea Nitrogen 6 (*) 7 - 25 MG/DL Final    Creatinine 0.45  0.4 - 1.00 MG/DL Final    Calcium 9.0  8.5 - 10.6 MG/DL Final    Total Protein 6.9  6.0 - 8.0 G/DL Final    Total Bilirubin 0.3  0.3 - 1.2 MG/DL Final    Albumin 4.0  3.5 -  5.0 G/DL Final    Alk Phosphatase 107  25 - 110 U/L Final    AST (SGOT) 11  7 - 40 U/L Final    CO2 22  21 - 30 MMOL/L Final    ALT (SGPT) 12  7 - 56 U/L Final    Anion Gap 10  3 - 12 Final    eGFR >60  >60 mL/min Final   URINALYSIS DIPSTICK REFLEX TO CULTURE - Abnormal    Color,UA YELLOW   Final    Turbidity,UA 1+ (*) CLEAR-CLEAR Final    Specific Gravity-Urine 1.008  1.005 - 1.030 Final    pH,UA 6.0  5.0 - 8.0 Final    Protein,UA NEG  NEG-NEG Final    Glucose,UA NEG  NEG-NEG Final    Ketones,UA NEG  NEG-NEG Final    Bilirubin,UA NEG  NEG-NEG Final    Blood,UA NEG  NEG-NEG Final    Urobilinogen,UA NORMAL  NORM-NORMAL Final    Nitrite,UA NEG  NEG-NEG Final    Leukocytes,UA NEG  NEG-NEG Final    Urine Ascorbic Acid, UA NEG  NEG-NEG Final   LIPASE    Lipase 71  11 - 82 U/L Final   URINALYSIS MICROSCOPIC REFLEX TO CULTURE    WBCs,UA 0-2  0 - 2 /HPF Final    RBCs,UA NONE  0 - 3 /HPF Final    Comment,UA     Final    Value: Criteria for reflex to culture are WBC>10, Positive Nitrite, and/or >=+1   leukocytes. If quantity is not sufficient, an addendum will follow.     POC LACTATE    LACTIC ACID POC 1.4  0.5 - 2.0 MMOL/L Final   UA REFLEX LABEL   POC LACTATE          Radiology Interpretation:    No orders to display         EKG:    Not obtained    ED Course:    Patient seen by ED attending alone.    In summary, patient is a 55 year old female who presents for evaluation of left lower quadrant abdominal pain.    Physical exam is notable for mildly tachycardic female who has left lower quadrant tenderness to palpation in the setting of reported blood in her stools, anemia on labs today, and history of diverticulosis/diverticulitis.    Digital rectal exam negative for bright red blood or melena, guaiac negative.    CT ordered for evaluation of acute diverticulitis.    I was informed by nurse that the patient's left prior to discussing pain medications with me as she was disappointed by the initial order of Tylenol and Zofran.    Review of patient's prior ED visits and review of outside records unfortunately notes that this is a somewhat common occurrence for the patient.    Patient left before treatment was completed and before acute distress work-up or alternative pain medication regimens with her.      MDM  Reviewed: nursing note, previous chart and vitals  Reviewed previous: CT scan, MRI, ultrasound, x-ray and labs  Interpretation: labs and CT scan        Facility Administered Meds:  Medications   acetaminophen (TYLENOL EXTRA STRENGTH) tablet 1,000 mg (has no administration in time range)   ondansetron (ZOFRAN) injection 8 mg (has no administration in time range)   ONDANSETRON HCL (PF) 4 MG/2 ML IJ SOLN Yahoo) (has no administration in time range)       Active Problem List/Diagnosis  Related to Current Presentation:     Clinical Impression:  Clinical Impression   Hospital hopping   Anemia, unspecified type   Left lower quadrant abdominal pain       Disposition/Follow up  ED Disposition     ED Disposition    LBTC        No follow-up provider specified.    Medications:  Discharge Medication List as of 09/13/2020 11:15 AM          Procedure Notes:  Procedures      Attestation / Supervision:  Eulis Foster, am scribing for and in the presence of Chrisandra Netters, MD      Brett Albino / Supervision Note concerning Alletta Mattos Cephas: I personally performed the E/M including history, physical exam, and MDM. and I, Hinton Rao, MD, personally performed the services described in this documentation as scribed in my presence and it is both accurate and complete.    Hinton Rao, MD

## 2020-11-22 ENCOUNTER — Encounter: Admit: 2020-11-22 | Discharge: 2020-11-22 | Payer: PRIVATE HEALTH INSURANCE

## 2020-11-22 ENCOUNTER — Emergency Department: Admit: 2020-11-22 | Discharge: 2020-11-22 | Discharge status: Against Medical Advice | Payer: PRIVATE HEALTH INSURANCE

## 2020-11-22 DIAGNOSIS — R1013 Epigastric pain: Secondary | ICD-10-CM

## 2020-11-22 LAB — URINALYSIS MICROSCOPIC REFLEX TO CULTURE

## 2020-11-22 LAB — CBC AND DIFF
ABSOLUTE BASO COUNT: 0 K/UL (ref 0–0.20)
MDW (MONOCYTE DISTRIBUTION WIDTH): 17 (ref <20.7)
RBC COUNT: 4 M/UL (ref 4.0–5.0)
WBC COUNT: 5.6 K/UL (ref 4.5–11.0)

## 2020-11-22 LAB — URINALYSIS DIPSTICK REFLEX TO CULTURE
LEUKOCYTES: NEGATIVE mL/min (ref 0–0.80)
NITRITE: NEGATIVE K/UL — ABNORMAL LOW (ref 3–12)
URINE ASCORBIC ACID, UA: NEGATIVE K/UL (ref 0–0.45)
URINE BILE: NEGATIVE U/L (ref 7–40)
URINE BLOOD: NEGATIVE MMOL/L (ref 21–30)
URINE KETONE: NEGATIVE U/L — ABNORMAL HIGH (ref 25–110)

## 2020-11-22 LAB — COMPREHENSIVE METABOLIC PANEL
CHLORIDE: 109 MMOL/L — ABNORMAL LOW (ref 98–110)
GLUCOSE,PANEL: 110 mg/dL — ABNORMAL HIGH (ref 70–100)
SODIUM: 144 MMOL/L — ABNORMAL LOW (ref 137–147)

## 2020-11-22 LAB — LIPASE: LIPASE: 25 U/L — ABNORMAL LOW (ref 11–82)

## 2020-11-22 MED ORDER — LACTATED RINGERS IV SOLP
1000 mL | Freq: Once | INTRAVENOUS | 0 refills | Status: DC
Start: 2020-11-22 — End: 2020-11-22

## 2020-11-22 MED ORDER — ONDANSETRON HCL (PF) 4 MG/2 ML IJ SOLN
4 mg | Freq: Once | INTRAVENOUS | 0 refills | Status: DC
Start: 2020-11-22 — End: 2020-11-22

## 2020-11-22 MED ORDER — ACETAMINOPHEN/LIDOCAINE/ANTACID DS(#) 1:1:3  PO SUSP
30 mL | Freq: Once | ORAL | 0 refills | Status: DC
Start: 2020-11-22 — End: 2020-11-22

## 2020-11-22 MED ORDER — FENTANYL CITRATE (PF) 50 MCG/ML IJ SOLN
50 ug | Freq: Once | INTRAVENOUS | 0 refills | Status: DC
Start: 2020-11-22 — End: 2020-11-22

## 2020-11-22 NOTE — ED Notes
Dr. Ranelle Oyster at bedside educating patient. Dr. Allena Katz notified this RN that patient wishes to leave and she will prepare [facility]TC paperwork.

## 2020-11-22 NOTE — ED Notes
When provided medications, patient upset stating "that's what you call medication?". This RN educated patient on uses of GI cocktail, however patient refused. Patient offered Zofran and IV fluids, patient refused as well stating "I just want to go". Dr. Ranelle Oyster notified of patient's desire to leave.

## 2020-11-22 NOTE — ED Notes
Patient removed herself from monitor, removed IV, and ambulated out of department with a steady gait.

## 2020-12-02 ENCOUNTER — Encounter: Admit: 2020-12-02 | Discharge: 2020-12-02 | Payer: PRIVATE HEALTH INSURANCE

## 2020-12-02 ENCOUNTER — Emergency Department
Admit: 2020-12-02 | Discharge: 2020-12-02 | Discharge status: Against Medical Advice | Payer: PRIVATE HEALTH INSURANCE | Attending: Student in an Organized Health Care Education/Training Program

## 2020-12-02 DIAGNOSIS — R109 Unspecified abdominal pain: Secondary | ICD-10-CM

## 2020-12-02 DIAGNOSIS — R000 Tachycardia, unspecified: Secondary | ICD-10-CM

## 2020-12-02 LAB — CBC AND DIFF
ABSOLUTE BASO COUNT: 0 K/UL (ref 0–0.20)
ABSOLUTE EOS COUNT: 0.1 K/UL (ref 0–0.45)
ABSOLUTE MONO COUNT: 0.3 K/UL (ref 0–0.80)
LYMPHOCYTES %: 15 % — ABNORMAL LOW (ref 24–44)
MCHC: 32 g/dL (ref 32.0–36.0)
MPV: 7.1 FL — AB (ref 7–11)
WBC COUNT: 5.2 K/UL (ref 4.5–11.0)

## 2020-12-02 LAB — URINALYSIS MICROSCOPIC REFLEX TO CULTURE

## 2020-12-02 LAB — COMPREHENSIVE METABOLIC PANEL
ALBUMIN: 4.6 g/dL (ref 3.5–5.0)
AST: 13 U/L — AB (ref 7–40)
CHLORIDE: 103 MMOL/L — ABNORMAL LOW (ref 98–110)
EGFR: >60 mL/min — ABNORMAL LOW (ref >60)
SODIUM: 141 MMOL/L (ref 137–147)

## 2020-12-02 LAB — URINALYSIS DIPSTICK REFLEX TO CULTURE
NITRITE: NEGATIVE % (ref 0–5)
URINE ASCORBIC ACID, UA: NEGATIVE K/UL (ref 3–12)

## 2020-12-02 LAB — LIPASE: LIPASE: 19 U/L — ABNORMAL LOW (ref 11–82)

## 2020-12-02 MED ORDER — ONDANSETRON HCL (PF) 4 MG/2 ML IJ SOLN
4 mg | Freq: Once | INTRAVENOUS | 0 refills | Status: CP
Start: 2020-12-02 — End: ?
  Administered 2020-12-02: 22:00:00 4 mg via INTRAVENOUS

## 2020-12-02 MED ORDER — ACETAMINOPHEN 500 MG PO TAB
1000 mg | Freq: Once | ORAL | 0 refills | Status: DC
Start: 2020-12-02 — End: 2020-12-03

## 2020-12-02 NOTE — ED Notes
55 y/o female admitted to ED hall results waiting with CC of abdominal pain. Pt reports a sudden onset of LLQ pain that radiates to her umbilicus starting at 0200 this morning. Pt states she has also had multiple episodes of diarrhea with some scant blood seen in her stool and endorses n/v. Pt states she had a temperature at 1400 today and took tylonol. Pt A&Ox4, resting in bed, respirations even and non-labored. Skin is warm, dry, and appropriate for ethnicity.     Belongings: shirt, shoes, and pants.

## 2020-12-02 NOTE — ED Notes
Pt witnessed on camera leaving ED wo IV in place at 1730. Provider made aware.

## 2020-12-24 ENCOUNTER — Emergency Department: Admit: 2020-12-24 | Discharge: 2020-12-25 | Payer: PRIVATE HEALTH INSURANCE

## 2020-12-24 ENCOUNTER — Encounter: Admit: 2020-12-24 | Discharge: 2020-12-24 | Payer: PRIVATE HEALTH INSURANCE

## 2020-12-24 ENCOUNTER — Emergency Department: Admit: 2020-12-24 | Discharge: 2020-12-24 | Payer: PRIVATE HEALTH INSURANCE

## 2020-12-24 DIAGNOSIS — K626 Ulcer of anus and rectum: Secondary | ICD-10-CM

## 2020-12-24 DIAGNOSIS — K5730 Diverticulosis of large intestine without perforation or abscess without bleeding: Secondary | ICD-10-CM

## 2020-12-24 DIAGNOSIS — F419 Anxiety disorder, unspecified: Secondary | ICD-10-CM

## 2020-12-24 DIAGNOSIS — E785 Hyperlipidemia, unspecified: Secondary | ICD-10-CM

## 2020-12-24 DIAGNOSIS — K429 Umbilical hernia without obstruction or gangrene: Secondary | ICD-10-CM

## 2020-12-24 DIAGNOSIS — W540XXA Bitten by dog, initial encounter: Secondary | ICD-10-CM

## 2020-12-24 DIAGNOSIS — N823 Fistula of vagina to large intestine: Secondary | ICD-10-CM

## 2020-12-24 DIAGNOSIS — K219 Gastro-esophageal reflux disease without esophagitis: Secondary | ICD-10-CM

## 2020-12-24 DIAGNOSIS — K760 Fatty (change of) liver, not elsewhere classified: Secondary | ICD-10-CM

## 2020-12-24 DIAGNOSIS — D649 Anemia, unspecified: Secondary | ICD-10-CM

## 2020-12-24 DIAGNOSIS — S301XXA Contusion of abdominal wall, initial encounter: Secondary | ICD-10-CM

## 2020-12-24 DIAGNOSIS — S7012XA Contusion of left thigh, initial encounter: Secondary | ICD-10-CM

## 2020-12-24 LAB — POC CREATININE, RAD: CREATININE, POC: 0.5 mg/dL (ref 0.4–1.00)

## 2020-12-24 LAB — COMPREHENSIVE METABOLIC PANEL
ALBUMIN: 4 g/dL — ABNORMAL LOW (ref 3.5–5.0)
ANION GAP: 9 K/UL — ABNORMAL LOW (ref 3–12)
AST: 11 U/L (ref 7–40)
BLD UREA NITROGEN: 14 mg/dL (ref 7–25)
CHLORIDE: 105 MMOL/L — ABNORMAL LOW (ref 98–110)
CO2: 26 MMOL/L (ref 21–30)
EGFR: >60 mL/min (ref >60)
GLUCOSE,PANEL: 110 mg/dL — ABNORMAL HIGH (ref 70–100)
POTASSIUM: 4.4 MMOL/L — ABNORMAL LOW (ref 3.5–5.1)
SODIUM: 140 MMOL/L — ABNORMAL LOW (ref 137–147)

## 2020-12-24 LAB — POC LACTATE: LACTIC ACID POC: 1.4 MMOL/L (ref 0.5–2.0)

## 2020-12-24 LAB — CBC AND DIFF
ABSOLUTE BASO COUNT: 0 K/UL (ref 0–0.20)
ABSOLUTE EOS COUNT: 0.1 K/UL (ref 0–0.45)
ABSOLUTE NEUTROPHIL: 2.7 K/UL (ref 1.8–7.0)
MDW (MONOCYTE DISTRIBUTION WIDTH): 18 (ref <20.7)
MONOCYTES %: 9 % — ABNORMAL HIGH (ref 4–12)
NEUTROPHILS %: 70 % (ref 41–77)
PLATELET COUNT: 312 K/UL — ABNORMAL HIGH (ref 150–400)
WBC COUNT: 3.8 K/UL — ABNORMAL LOW (ref 4.5–11.0)

## 2020-12-24 MED ORDER — MORPHINE 4 MG/ML IV SYRG
4 mg | Freq: Once | INTRAVENOUS | 0 refills | Status: CP
Start: 2020-12-24 — End: ?
  Administered 2020-12-25: 01:00:00 4 mg via INTRAVENOUS

## 2020-12-24 MED ORDER — DOXYCYCLINE 100ML IVPB
100 mg | Freq: Once | INTRAVENOUS | 0 refills | Status: CP
Start: 2020-12-24 — End: ?
  Administered 2020-12-24 (×2): 100 mg via INTRAVENOUS

## 2020-12-24 MED ORDER — IOHEXOL 350 MG IODINE/ML IV SOLN
100 mL | Freq: Once | INTRAVENOUS | 0 refills | Status: CP
Start: 2020-12-24 — End: ?
  Administered 2020-12-24: 21:00:00 100 mL via INTRAVENOUS

## 2020-12-24 MED ORDER — MORPHINE 4 MG/ML IV SYRG
4 mg | Freq: Once | INTRAVENOUS | 0 refills | Status: CP
Start: 2020-12-24 — End: ?
  Administered 2020-12-24: 22:00:00 4 mg via INTRAVENOUS

## 2020-12-24 MED ORDER — AMPICILLIN/SULBACTAM 3G/100ML NS IVPB (MB+)
3 g | Freq: Once | INTRAVENOUS | 0 refills | Status: CP
Start: 2020-12-24 — End: ?
  Administered 2020-12-24 (×2): 3 g via INTRAVENOUS

## 2020-12-24 MED ORDER — SODIUM CHLORIDE 0.9 % IJ SOLN
50 mL | Freq: Once | INTRAVENOUS | 0 refills | Status: CP
Start: 2020-12-24 — End: ?
  Administered 2020-12-24: 21:00:00 50 mL via INTRAVENOUS

## 2020-12-24 MED ORDER — ONDANSETRON HCL (PF) 4 MG/2 ML IJ SOLN
4 mg | Freq: Once | INTRAVENOUS | 0 refills | Status: CP
Start: 2020-12-24 — End: ?
  Administered 2020-12-24: 22:00:00 4 mg via INTRAVENOUS

## 2020-12-24 MED ORDER — OXYCODONE 5 MG PO TAB
5 mg | ORAL_TABLET | ORAL | 0 refills | 6.00000 days | Status: AC | PRN
Start: 2020-12-24 — End: ?

## 2020-12-24 MED ORDER — MORPHINE 4 MG/ML IV SYRG
4 mg | Freq: Once | INTRAVENOUS | 0 refills | Status: CP
Start: 2020-12-24 — End: ?
  Administered 2020-12-25: 03:00:00 4 mg via INTRAVENOUS

## 2020-12-24 MED ORDER — ONDANSETRON HCL (PF) 4 MG/2 ML IJ SOLN
4 mg | Freq: Once | INTRAVENOUS | 0 refills | Status: CP
Start: 2020-12-24 — End: ?
  Administered 2020-12-25: 03:00:00 4 mg via INTRAVENOUS

## 2020-12-24 MED ORDER — ONDANSETRON HCL (PF) 4 MG/2 ML IJ SOLN
4 mg | Freq: Once | INTRAVENOUS | 0 refills | Status: CP
Start: 2020-12-24 — End: ?
  Administered 2020-12-24: 20:00:00 4 mg via INTRAVENOUS

## 2020-12-24 MED ORDER — FENTANYL CITRATE (PF) 50 MCG/ML IJ SOLN
50 ug | Freq: Once | INTRAVENOUS | 0 refills | Status: CP
Start: 2020-12-24 — End: ?
  Administered 2020-12-24: 20:00:00 50 ug via INTRAVENOUS

## 2020-12-24 NOTE — ED Notes
56 y/o female admitted to ED rm 12 via triage w/ CC of vomiting and abdominal pain. Pt states about a week ago she was attacked by a dig that was up to date on its rabies shots. Pt states she went to [facility] and was placed on an antibiotic that she finished yesterday and had stitches placed that fell out 2-3 days ago. Pt states she noticed a "lump" to her RLQ where the dog bit her that has grown in size. Pt reports vomiting starting last night and states she has vomited 5x today. Pt A&Ox4, resting in bed, respirations even and non-labored. Skin is warm, dry, intact and appropriate for ethnicity. Pt hooked up to monitors. Bed is in lowest locked position, call light within reach.       Belongings: shirt, shoes and pants.

## 2020-12-25 NOTE — ED Notes
Pt provided with discharge instructions and follow-up care. Pt verbalizes understanding of instructions and denies additional questions or needs at this time. Pt's IV out and pt is ambulating to the waiting room with a steady gait to wait for a cab at this time.

## 2020-12-30 ENCOUNTER — Emergency Department: Admit: 2020-12-30 | Discharge: 2020-12-30 | Discharge status: Against Medical Advice | Payer: PRIVATE HEALTH INSURANCE

## 2020-12-30 ENCOUNTER — Encounter: Admit: 2020-12-30 | Discharge: 2020-12-30 | Payer: PRIVATE HEALTH INSURANCE

## 2020-12-30 DIAGNOSIS — E785 Hyperlipidemia, unspecified: Secondary | ICD-10-CM

## 2020-12-30 DIAGNOSIS — K429 Umbilical hernia without obstruction or gangrene: Secondary | ICD-10-CM

## 2020-12-30 DIAGNOSIS — K760 Fatty (change of) liver, not elsewhere classified: Secondary | ICD-10-CM

## 2020-12-30 DIAGNOSIS — K219 Gastro-esophageal reflux disease without esophagitis: Secondary | ICD-10-CM

## 2020-12-30 DIAGNOSIS — N823 Fistula of vagina to large intestine: Secondary | ICD-10-CM

## 2020-12-30 DIAGNOSIS — K5730 Diverticulosis of large intestine without perforation or abscess without bleeding: Secondary | ICD-10-CM

## 2020-12-30 DIAGNOSIS — K626 Ulcer of anus and rectum: Secondary | ICD-10-CM

## 2020-12-30 DIAGNOSIS — F419 Anxiety disorder, unspecified: Secondary | ICD-10-CM

## 2020-12-30 DIAGNOSIS — R1032 Left lower quadrant pain: Secondary | ICD-10-CM

## 2020-12-30 DIAGNOSIS — D649 Anemia, unspecified: Secondary | ICD-10-CM

## 2020-12-30 NOTE — ED Provider Notes
Tanya French is a 55 y.o. female.    Chief Complaint:  Chief Complaint   Patient presents with   ? Abdominal pain     Dog attack 2 weeks ago, seen in the ED last week for LLQ abdominal pain, had CT scan done, no dx, sent home on pain medications and told to watch site, pt having worsening 9/10 sharp pain to the LLQ, unable to keep anything down, when taking zofran feels better, having low grade fever at home 100.5 F, abdomen is distended to site, firm to the touch       History of Present Illness:  Chief complaint: Pain left lower abdomen    Tanya French is a 55 y.o.who has a past medical history of Anemia, Anxiety, GERD (gastroesophageal reflux disease), Hepatic steatosis, Hyperlipidemia, Periumbilical hernia, Rectal ulcer, 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    This 55 year old female is here complaining of pain to the left lower abdomen.  Patient states that she was bit by dog about 2 weeks ago.  She states she was seen and evaluated and started on Augmentin.  Patient is also been on Zofran and oxycodone.  She feels her pain is gotten gradually worse.  Patient denies any nausea vomiting fever or chills.  No difficulty with bowel movements.  No constipation or diarrhea.  She has noticed no increased redness to this area.  This area is tender to touch.  On reviewing patient's past charts and medical history.  She was seen here 6 days ago with the same complaint.  Lab work was drawn and a CT of her abdomen was obtained with contrast.  This showed an abdominal hematoma.  On reviewing patient's chart and care everywhere another facilities.  Patient has been seen repeatedly at multiple hospitals in the [facility] area and was recently evaluated yesterday at [facility].          Review of Systems:  Review of Systems   Constitutional: Negative for appetite change, chills, fatigue and fever.   HENT: Negative for ear pain, rhinorrhea, sinus pressure and sinus pain. Eyes: Negative for visual disturbance.   Respiratory: Negative.  Negative for cough and wheezing.    Cardiovascular: Negative.    Gastrointestinal: Positive for abdominal pain. Negative for abdominal distention, blood in stool, constipation, diarrhea, nausea and vomiting.   Genitourinary: Negative.  Negative for dysuria, frequency, pelvic pain and urgency.   Musculoskeletal: Negative.  Negative for gait problem, joint swelling and neck pain.   Skin: Negative.    Neurological: Negative for syncope, weakness, light-headedness and headaches.   Psychiatric/Behavioral: Negative.  Negative for agitation and behavioral problems. The patient is not nervous/anxious.        Allergies:  Toradol [ketorolac], Keflex [cephalexin], and Compazine [prochlorperazine]    Past Medical History:  Medical History:   Diagnosis Date   ? Anemia    ? Anxiety    ? GERD (gastroesophageal reflux disease)    ? Hepatic steatosis    ? Hyperlipidemia    ? Periumbilical hernia    ? Rectal ulcer    ? 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 Sigmoid diverticulosis        Past Surgical History:  Surgical History:   Procedure Laterality Date   ? ACL RECONSTRUCTION  2008   ? HYSTERECTOMY  2009    R oophorectomy   ? APPENDECTOMY  2009   ? CHOLECYSTECTOMY  2009   ?  OOPHORECTOMY  2010    L oophorectomy   ? CARPAL TUNNEL RELEASE  2012   ? SIGMOIDOSCOPY DIAGNOSTIC N/A 03/20/2015    Performed by Tim Lair, MD at [facility] ENDO   ? SIGMOIDOSCOPY BIOPSY  03/20/2015    Performed by Tim Lair, MD at [facility] ENDO   ? COLONOSCOPY N/A 08/05/2016    Performed by Tommie Sams, MD at [facility] ENDO   ? COLONOSCOPY BIOPSY  08/05/2016    Performed by Tommie Sams, MD at [facility] ENDO   ? PLANTAR FASCIA SURGERY         Pertinent medical/surgical history reviewed  Medical History:   Diagnosis Date   ? Anemia    ? Anxiety    ? GERD (gastroesophageal reflux disease)    ? Hepatic steatosis    ? Hyperlipidemia    ? Periumbilical hernia    ? Rectal ulcer    ? 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 Sigmoid diverticulosis      Surgical History:   Procedure Laterality Date   ? ACL RECONSTRUCTION  2008   ? HYSTERECTOMY  2009    R oophorectomy   ? APPENDECTOMY  2009   ? CHOLECYSTECTOMY  2009   ? OOPHORECTOMY  2010    L oophorectomy   ? CARPAL TUNNEL RELEASE  2012   ? SIGMOIDOSCOPY DIAGNOSTIC N/A 03/20/2015    Performed by Tim Lair, MD at [facility] ENDO   ? SIGMOIDOSCOPY BIOPSY  03/20/2015    Performed by Tim Lair, MD at [facility] ENDO   ? COLONOSCOPY N/A 08/05/2016    Performed by Tommie Sams, MD at [facility] ENDO   ? COLONOSCOPY BIOPSY  08/05/2016    Performed by Tommie Sams, MD at [facility] ENDO   ? PLANTAR FASCIA SURGERY           Social History:  Social History     Tobacco Use   ? Smoking status: Former Smoker     Packs/day: 0.50     Years: 20.00     Pack years: 10.00     Types: Cigarettes     Quit date: 12/14/2016     Years since quitting: 4.0   ? Smokeless tobacco: Never Used   Vaping Use   ? Vaping Use: Never used   Substance Use Topics   ? Alcohol use: Yes     Comment: social   ? Drug use: No     Social History     Substance and Sexual Activity   Drug Use No             Family History:  Family History   Problem Relation Age of Onset   ? Cancer Maternal Grandmother         colon CA at age 32   ? Diabetes Father    ? Heart Attack Father    ? Hypertension Father    ? Other Father         arrhythmias   ? Hypertension Mother    ? Other Mother         COPD       Vitals:  ED Vitals    Date and Time T BP P RR SPO2P SPO2 User   12/30/20 1441 36.6 ?C (97.8 ?F) 143/99 -- 18 PER MINUTE 92 100 % MN          Physical Exam:  Physical Exam  Vitals and nursing note reviewed.   Constitutional:  General: She is not in acute distress.     Appearance: She is normal weight. She is not ill-appearing or toxic-appearing.   HENT:      Head: Normocephalic.      Nose: Nose normal.      Mouth/Throat:      Mouth: Mucous membranes are moist.      Pharynx: Oropharynx is clear.   Eyes:      Pupils: Pupils are equal, round, and reactive to light.   Cardiovascular:      Rate and Rhythm: Normal rate and regular rhythm.   Pulmonary:      Effort: Pulmonary effort is normal.      Breath sounds: Normal breath sounds.   Abdominal:      General: Abdomen is flat. Bowel sounds are normal.      Palpations: Abdomen is soft.      Tenderness: There is abdominal tenderness in the left lower quadrant. There is no guarding or rebound. Negative signs include Murphy's sign.       Musculoskeletal:         General: Normal range of motion.      Cervical back: Normal range of motion and neck supple.   Skin:     General: Skin is warm and dry.   Neurological:      General: No focal deficit present.      Mental Status: She is alert and oriented to person, place, and time. Mental status is at baseline.   Psychiatric:         Mood and Affect: Mood normal.         Behavior: Behavior normal.         Laboratory Results:  Labs Reviewed - No data to display       Radiology Interpretation:    n/a    EKG:      ED Course:  This 55 year old female is here for evaluation of left lower quadrant abdominal pain.  She is in no distress.  Patient is afebrile she is nontachycardic and nontachypneic.  On reviewing patient's record she has been seen in multiple hospitals with the same complaint.  Patient is informed we will repeat lab work to determine if she appears to have infection and will check her WBCs.  Patient is informed she will need to continue with the medication she has for pain or we can give her Tylenol or ibuprofen.  At 1728 when patient is informed of this she decides to leave the emergency department AGAINST MEDICAL ADVICE.       ED Scoring:                                MDM  Reviewed: previous chart, nursing note and vitals        Facility Administered Meds:  Medications - No data to display      Clinical Impression:  Clinical Impression   None       Disposition/Follow up  ED Disposition     ED Disposition   AMA           No follow-up provider specified.    Medications:  Discharge Medication List as of 12/30/2020  5:25 PM          Procedure Notes:  Procedures      Attestation / Supervision:  The service was provided by the APP alone with immediate availability of a physician in the ED.  Earl Lites, APRN-NP

## 2020-12-30 NOTE — ED Notes
Tanya Greener, APRN and I went to pt to offer her lab work to see if she had an infection. Tanya Fusi, APRN told pt she would not be receiving pain medication and pt got agitated and started gathering her belongings and said "Show me the way out of here." Pt signed AMA paperwork and left with her belongings. Pt ambulated out of ED with a steady gait.

## 2021-01-20 ENCOUNTER — Encounter: Admit: 2021-01-20 | Discharge: 2021-01-20 | Payer: PRIVATE HEALTH INSURANCE

## 2021-02-21 ENCOUNTER — Encounter: Admit: 2021-02-21 | Discharge: 2021-02-21 | Payer: PRIVATE HEALTH INSURANCE

## 2021-02-21 ENCOUNTER — Emergency Department: Admit: 2021-02-21 | Discharge: 2021-02-21 | Discharge status: Against Medical Advice | Payer: PRIVATE HEALTH INSURANCE

## 2021-02-21 DIAGNOSIS — M542 Cervicalgia: Secondary | ICD-10-CM

## 2021-02-21 DIAGNOSIS — W11XXXA Fall on and from ladder, initial encounter: Secondary | ICD-10-CM

## 2021-02-21 DIAGNOSIS — M5442 Lumbago with sciatica, left side: Secondary | ICD-10-CM

## 2021-02-21 MED ORDER — OXYCODONE 5 MG PO TAB
5 mg | Freq: Once | ORAL | 0 refills | Status: CP
Start: 2021-02-21 — End: ?
  Administered 2021-02-21: 19:00:00 5 mg via ORAL

## 2021-02-21 NOTE — ED Provider Notes
Tanya French is a 55 y.o. female.    Chief Complaint:  Chief Complaint   Patient presents with   ? Genia Hotter from ladder 5 steps up onto table hitting back and lower neck/head, +tenderness in lower C-spine, denies LOC, denies blood thinners       History of Present Illness:  55 year old female with history of anemia, anxiety, GERD, hyperlipidemia, hepatic steatosis who presents after a fall.  Patient states around 9:30 AM she fell off a ladder onto her back.  Patient states she was about 5 rungs up on the ladder.  She states that she slipped and fell onto a wrought iron table.  Patient reports hitting her back and head.  She denies any loss of consciousness.  Patient reports pain in her shoulders, neck, and low back.  She also reports nausea.  She denies any headache.  Patient was able to ambulate after the fall.  She states she tried to lay down and took ibuprofen but continued to have pain so came to the ED for further evaluation.          Review of Systems:  Review of Systems   Constitutional: Negative for activity change, appetite change, chills, diaphoresis, fatigue and fever.   HENT: Negative for congestion, facial swelling, nosebleeds, rhinorrhea, sore throat, trouble swallowing and voice change.    Eyes: Negative for photophobia, pain, discharge, redness, itching and visual disturbance.   Respiratory: Negative for cough, chest tightness and shortness of breath.    Cardiovascular: Negative for chest pain, palpitations and leg swelling.   Gastrointestinal: Positive for nausea. Negative for abdominal pain, constipation, diarrhea and vomiting.   Endocrine: Negative for polydipsia and polyuria.   Genitourinary: Negative for difficulty urinating, dysuria, flank pain and hematuria.   Musculoskeletal: Positive for arthralgias (Shoulder pain ), back pain and neck pain. Negative for myalgias.   Skin: Negative for rash and wound.   Neurological: Negative for dizziness, tremors, seizures, weakness, light-headedness, numbness and headaches.       Allergies:  Toradol [ketorolac], Keflex [cephalexin], and Compazine [prochlorperazine]    Past Medical History:  Medical History:   Diagnosis Date   ? Anemia    ? Anxiety    ? GERD (gastroesophageal reflux disease)    ? Hepatic steatosis    ? Hyperlipidemia    ? Periumbilical hernia    ? Rectal ulcer    ? 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 Sigmoid diverticulosis        Past Surgical History:  Surgical History:   Procedure Laterality Date   ? ACL RECONSTRUCTION  2008   ? HYSTERECTOMY  2009    R oophorectomy   ? APPENDECTOMY  2009   ? CHOLECYSTECTOMY  2009   ? OOPHORECTOMY  2010    L oophorectomy   ? CARPAL TUNNEL RELEASE  2012   ? SIGMOIDOSCOPY DIAGNOSTIC N/A 03/20/2015    Performed by Tim Lair, MD at [facility] ENDO   ? SIGMOIDOSCOPY BIOPSY  03/20/2015    Performed by Tim Lair, MD at [facility] ENDO   ? COLONOSCOPY N/A 08/05/2016    Performed by Tommie Sams, MD at [facility] ENDO   ? COLONOSCOPY BIOPSY  08/05/2016    Performed by Tommie Sams, MD at [facility] ENDO   ? PLANTAR FASCIA SURGERY         Pertinent medical/surgical history reviewed    Social History:  Social History  Tobacco Use   ? Smoking status: Former     Packs/day: 0.50     Years: 20.00     Pack years: 10.00     Types: Cigarettes     Quit date: 12/14/2016     Years since quitting: 4.1   ? Smokeless tobacco: Never   Vaping Use   ? Vaping Use: Never used   Substance Use Topics   ? Alcohol use: Yes     Comment: social   ? Drug use: No     Social History     Substance and Sexual Activity   Drug Use No             Family History:  Family History   Problem Relation Age of Onset   ? Cancer Maternal Grandmother         colon CA at age 13   ? Diabetes Father    ? Heart Attack Father    ? Hypertension Father    ? Other Father         arrhythmias   ? Hypertension Mother    ? Other Mother         COPD       Vitals:  ED Vitals    Date and Time T BP P RR SPO2P SPO2 User   02/21/21 1301 -- 122/91 89 18 PER MINUTE 89 98 % HP   02/21/21 1216 36.8 ?C (98.3 ?F) 140/98 -- 15 PER MINUTE 98 99 % JF          Physical Exam:  Physical Exam  Vitals and nursing note reviewed.   Constitutional:       General: She is not in acute distress.     Appearance: Normal appearance. She is not ill-appearing or toxic-appearing.   HENT:      Head: Normocephalic and atraumatic.      Right Ear: External ear normal.      Left Ear: External ear normal.      Nose: Nose normal.      Mouth/Throat:      Mouth: Mucous membranes are moist.      Pharynx: Oropharynx is clear.   Eyes:      Extraocular Movements: Extraocular movements intact.      Conjunctiva/sclera: Conjunctivae normal.      Pupils: Pupils are equal, round, and reactive to light.   Cardiovascular:      Rate and Rhythm: Normal rate and regular rhythm.      Pulses: Normal pulses.      Heart sounds: Normal heart sounds.   Pulmonary:      Effort: Pulmonary effort is normal. No respiratory distress.      Breath sounds: Normal breath sounds.   Chest:      Chest wall: No tenderness.   Abdominal:      General: There is no distension.      Palpations: Abdomen is soft.      Tenderness: There is no abdominal tenderness.   Musculoskeletal:         General: Tenderness (L spine tender to palpation ) present. Normal range of motion.      Cervical back: Tenderness present.      Right lower leg: No edema.      Left lower leg: No edema.   Skin:     General: Skin is warm and dry.   Neurological:      General: No focal deficit present.      Mental Status: She is alert  and oriented to person, place, and time.         Laboratory Results:  Labs Reviewed - No data to display       Radiology Interpretation:    No orders to display         EKG:      Medical Decision Making:  Labrina Keigher is a 55 y.o. female who presents with chief complaint as listed above. Based on the history and presentation, the following differential was considered: Acute traumatic injury of the neck, acute traumatic head injury, acute traumatic spinal cord injury.    ED Course  Patient presents after a fall.  Triage note and vital signs reviewed.  Patient seen and examined.  Patient in a c-collar on arrival to the emergency department.  Patient with C and L-spine tenderness to palpation on exam.  No neurodeficits noted on exam.  No abrasions, bruising, or other signs of trauma on exam.    Given mechanism and areas of tenderness, concern for acute traumatic injuries of the spine.  Will CT head, C-spine, T-spine, L-spine.    Patient given one-time dose of oxycodone for pain control.    Patient requesting further pain medication doses.  Per chart review and review of outside records, patient has a pattern of presenting to the emergency department for acute injuries requesting pain medications.  Patient often leaves AMA or before treatment is completed due to not receiving narcotics.  Discussed with patient option of trialing muscle relaxer while awaiting for definitive CT results.    Informed by nurse that patient had left the emergency department.  Patient left before treatment completed.         Complexity of Problems Addressed  Patient's active diagnoses as well as contributing pre-existing medical problems include:  Clinical Impression   Fall from ladder, initial encounter   Neck pain   Acute midline low back pain with left-sided sciatica     Evaluation performed for potential threat to life or bodily function during this visit given the initial differential diagnosis and clinical impression(s) as discussed previously in MDM/ED course.    Additional data reviewed:    ? History was obtained from an independent historian: No  ? Prior non-ED notes reviewed: Outside ED record and Documentation from other hospital  ? Independent interpretation of diagnostic tests was performed by me: No  ? Patient presentation/management was discussed with the following qualified health care professionals and/or other relevant professionals: None    Risk evaluation:    ? Diagnosis or treatment of patient condition impacted by social determinant of health: None  ? Tests Considered but not performed due to clinical scoring (if not mentioned in ED course, aside from what is implied by clinical scores listed): NA  ? Rationale regarding whether admission or escalation of care considered if not performed (if not mentioned in ED course, aside from what is implied by clinical scores listed): NA    ED Scoring:                                Coding    Facility Administered Meds:  Medications   oxyCODONE (ROXICODONE) tablet 5 mg (5 mg Oral Given 02/21/21 1320)       Clinical Impression:  Clinical Impression   Fall from ladder, initial encounter   Neck pain   Acute midline low back pain with left-sided sciatica       Disposition/Follow up  ED Disposition     ED Disposition   LBTC           No follow-up provider specified.    Medications:  Discharge Medication List as of 02/21/2021  2:29 PM          Procedure Notes:  Procedures    Attestation / Supervision:        Clementeen Graham, MD  Emergency Medicine, PGY-2

## 2021-02-21 NOTE — ED Notes
P/t seen removing c-collar and monitor leads and states " I'm not fucking doing this" and then ambulates to exit w/ steady gait.  No distress noted.  No PIV to remove.

## 2021-02-21 NOTE — ED Notes
P/t resting on cart in C-spine on full monitor.  Endorses earlier today stepping down in her back yard and falling backwards onto a round picnic table.  Denies any LOC, no vision changes, no loss of bowel and bladder.  Endorses 9/10 pain to lower back that radiates to her abdomin.  Denies any other concerns.  Bed in low/locked position, side rails up x2, call light in reach.  P/t remains on monitor. No needs identified at this time. RR even and unlabored A&Ox4, GCS 15, skin W/P/D

## 2021-04-06 ENCOUNTER — Encounter: Admit: 2021-04-06 | Discharge: 2021-04-06 | Payer: PRIVATE HEALTH INSURANCE

## 2021-05-11 ENCOUNTER — Encounter: Admit: 2021-05-11 | Discharge: 2021-05-11 | Payer: PRIVATE HEALTH INSURANCE

## 2021-06-04 ENCOUNTER — Encounter: Admit: 2021-06-04 | Discharge: 2021-06-04 | Payer: BC Managed Care – PPO

## 2021-06-17 ENCOUNTER — Encounter: Admit: 2021-06-17 | Discharge: 2021-06-17 | Payer: BC Managed Care – PPO

## 2021-06-23 ENCOUNTER — Encounter: Admit: 2021-06-23 | Discharge: 2021-06-23 | Payer: BC Managed Care – PPO

## 2021-06-30 ENCOUNTER — Encounter: Admit: 2021-06-30 | Discharge: 2021-06-30 | Payer: BC Managed Care – PPO

## 2021-06-30 DIAGNOSIS — K219 Gastro-esophageal reflux disease without esophagitis: Secondary | ICD-10-CM

## 2021-06-30 DIAGNOSIS — N823 Fistula of vagina to large intestine: Secondary | ICD-10-CM

## 2021-06-30 DIAGNOSIS — E785 Hyperlipidemia, unspecified: Secondary | ICD-10-CM

## 2021-06-30 DIAGNOSIS — K429 Umbilical hernia without obstruction or gangrene: Secondary | ICD-10-CM

## 2021-06-30 DIAGNOSIS — K5730 Diverticulosis of large intestine without perforation or abscess without bleeding: Secondary | ICD-10-CM

## 2021-06-30 DIAGNOSIS — D509 Iron deficiency anemia, unspecified: Secondary | ICD-10-CM

## 2021-06-30 DIAGNOSIS — K626 Ulcer of anus and rectum: Secondary | ICD-10-CM

## 2021-06-30 DIAGNOSIS — H269 Unspecified cataract: Secondary | ICD-10-CM

## 2021-06-30 DIAGNOSIS — D649 Anemia, unspecified: Secondary | ICD-10-CM

## 2021-06-30 DIAGNOSIS — K760 Fatty (change of) liver, not elsewhere classified: Secondary | ICD-10-CM

## 2021-06-30 DIAGNOSIS — Z9889 Other specified postprocedural states: Secondary | ICD-10-CM

## 2021-06-30 DIAGNOSIS — F419 Anxiety disorder, unspecified: Secondary | ICD-10-CM

## 2021-06-30 LAB — COMPREHENSIVE METABOLIC PANEL
ALBUMIN: 4 g/dL (ref 3.5–5.0)
ALK PHOSPHATASE: 103 U/L — ABNORMAL LOW (ref 25–110)
ALT: 12 U/L (ref 7–56)
ANION GAP: 11 K/UL (ref 3–12)
AST: 11 U/L (ref 7–40)
BLD UREA NITROGEN: 16 mg/dL — ABNORMAL LOW (ref 7–25)
CALCIUM: 8.8 mg/dL — ABNORMAL HIGH (ref 8.5–10.6)
CHLORIDE: 102 MMOL/L — ABNORMAL LOW (ref 98–110)
CO2: 27 MMOL/L (ref 21–30)
CREATININE: 0.6 mg/dL (ref 0.4–1.00)
EGFR: >60 mL/min — ABNORMAL LOW (ref >60)
GLUCOSE,PANEL: 84 mg/dL — ABNORMAL LOW (ref 70–100)
POTASSIUM: 3.6 MMOL/L — ABNORMAL LOW (ref 3.5–5.1)
SODIUM: 140 MMOL/L — ABNORMAL LOW (ref 137–147)
TOTAL BILIRUBIN: 0.2 mg/dL — ABNORMAL LOW (ref 0.3–1.2)
TOTAL PROTEIN: 6.5 g/dL (ref 6.0–8.0)

## 2021-06-30 LAB — RETICULOCYTE COUNT: UNCORRECTED RETIC: 1.4 % (ref 0.5–2.0)

## 2021-06-30 LAB — CBC AND DIFF
ABSOLUTE BASO COUNT: 0 K/UL (ref 0–0.20)
ABSOLUTE EOS COUNT: 0.2 K/UL (ref 0–0.45)
ABSOLUTE MONO COUNT: 0.4 K/UL (ref 0–0.80)

## 2021-06-30 LAB — IRON + BINDING CAPACITY + %SAT+ FERRITIN
IRON BINDING: 551 ug/dL — ABNORMAL HIGH (ref 270–380)
IRON: 15 ug/dL — ABNORMAL LOW (ref 50–160)

## 2021-06-30 LAB — VITAMIN B12: VITAMIN B12: 243 pg/mL (ref 180–914)

## 2021-06-30 LAB — FOLATE, SERUM: SERUM FOLATE: 19 ng/mL (ref >3.9)

## 2021-07-01 ENCOUNTER — Encounter: Admit: 2021-07-01 | Discharge: 2021-07-01 | Payer: BC Managed Care – PPO

## 2021-07-01 DIAGNOSIS — F419 Anxiety disorder, unspecified: Secondary | ICD-10-CM

## 2021-07-01 DIAGNOSIS — E785 Hyperlipidemia, unspecified: Secondary | ICD-10-CM

## 2021-07-01 DIAGNOSIS — H269 Unspecified cataract: Secondary | ICD-10-CM

## 2021-07-01 DIAGNOSIS — K429 Umbilical hernia without obstruction or gangrene: Secondary | ICD-10-CM

## 2021-07-01 DIAGNOSIS — N823 Fistula of vagina to large intestine: Secondary | ICD-10-CM

## 2021-07-01 DIAGNOSIS — Z9889 Other specified postprocedural states: Secondary | ICD-10-CM

## 2021-07-01 DIAGNOSIS — K626 Ulcer of anus and rectum: Secondary | ICD-10-CM

## 2021-07-01 DIAGNOSIS — K219 Gastro-esophageal reflux disease without esophagitis: Secondary | ICD-10-CM

## 2021-07-01 DIAGNOSIS — K760 Fatty (change of) liver, not elsewhere classified: Secondary | ICD-10-CM

## 2021-07-01 DIAGNOSIS — D649 Anemia, unspecified: Secondary | ICD-10-CM

## 2021-07-01 DIAGNOSIS — K5730 Diverticulosis of large intestine without perforation or abscess without bleeding: Secondary | ICD-10-CM

## 2021-07-02 ENCOUNTER — Encounter: Admit: 2021-07-02 | Discharge: 2021-07-02 | Payer: BC Managed Care – PPO

## 2021-07-08 ENCOUNTER — Encounter: Admit: 2021-07-08 | Discharge: 2021-07-08 | Payer: BC Managed Care – PPO

## 2021-07-14 ENCOUNTER — Encounter: Admit: 2021-07-14 | Discharge: 2021-07-14 | Payer: BC Managed Care – PPO

## 2021-07-15 MED ORDER — IRON SUCROSE 100 MG IRON/5 ML IV SOLN GROUP
200 mg | Freq: Once | INTRAVENOUS | 0 refills | Status: CP
Start: 2021-07-15 — End: ?
  Administered 2021-07-16: 17:00:00 200 mg via INTRAVENOUS

## 2021-07-16 ENCOUNTER — Encounter
Admit: 2021-07-16 | Discharge: 2021-07-16 | Payer: BC Managed Care – PPO | Primary: Student in an Organized Health Care Education/Training Program

## 2021-07-16 DIAGNOSIS — D508 Other iron deficiency anemias: Secondary | ICD-10-CM

## 2021-07-16 DIAGNOSIS — K5730 Diverticulosis of large intestine without perforation or abscess without bleeding: Secondary | ICD-10-CM

## 2021-07-16 DIAGNOSIS — D509 Iron deficiency anemia, unspecified: Secondary | ICD-10-CM

## 2021-07-16 DIAGNOSIS — N823 Fistula of vagina to large intestine: Secondary | ICD-10-CM

## 2021-07-16 DIAGNOSIS — K219 Gastro-esophageal reflux disease without esophagitis: Secondary | ICD-10-CM

## 2021-07-16 DIAGNOSIS — F419 Anxiety disorder, unspecified: Secondary | ICD-10-CM

## 2021-07-16 DIAGNOSIS — D649 Anemia, unspecified: Secondary | ICD-10-CM

## 2021-07-16 DIAGNOSIS — K429 Umbilical hernia without obstruction or gangrene: Secondary | ICD-10-CM

## 2021-07-16 DIAGNOSIS — H269 Unspecified cataract: Secondary | ICD-10-CM

## 2021-07-16 DIAGNOSIS — Z9889 Other specified postprocedural states: Secondary | ICD-10-CM

## 2021-07-16 DIAGNOSIS — E785 Hyperlipidemia, unspecified: Secondary | ICD-10-CM

## 2021-07-16 DIAGNOSIS — K626 Ulcer of anus and rectum: Secondary | ICD-10-CM

## 2021-07-16 DIAGNOSIS — K760 Fatty (change of) liver, not elsewhere classified: Secondary | ICD-10-CM

## 2021-07-16 NOTE — Assessment & Plan Note
56 year old patient who was consulted by Dr. Carolina Sink for iron deficient anemia.  Patient is here today to receive Venofer 200 mg IV.  Patient states she has taken iron infusions in the past and has tolerated them well.  She believes she had a iron sucrose in the past but does not recall.  She also recalls that her grandmother had some kind of hematology disorder but since she has passed and her parents have passed she has no one to contact to find out any further history updates.    Today we reviewed all of the lab work that was performed on 06/30/2021 to include a CBC, CMP, B12, folic acid, iron studies, SPEP, and reticulocyte count.  With the exception of her iron studies and hemoglobin of 9.0 her SPEP was normal, no paraprotein seen.  Chemistry profile was essentially normal and reticulocyte count was normal.      Educated patient today on new therapy, Venofer.  I have provided both verbal and written information on medication to be given and the possible side effects of therapy.  We have discussed management of the side effects and I have provided them with the phone number [phone] and contact to call if needed.  I have allowed questions and have answered them to their satisfaction.   Patient verbalized understanding.  She will receive Venofer 200 mg IV weekly for 5 weeks.  Follow-up in approximately 2 months with repeat lab.  Patient verbalized understanding and verbal consent obtained

## 2021-07-16 NOTE — Progress Notes
Name: Tanya French          MRN: [id number]      DOB: 24-Mar-1965      AGE: 56 y.o.   DATE OF SERVICE: 07/16/2021    Subjective:             Reason for Visit:  Heme/Onc Care      Tanya French is a 56 y.o. female.         History of Present Illness  56 year old patient here today for IV infusion with Venofer.  Patient has had deficiency in the past.  She is anemic.  She is symptomatic with lightheadedness with changing positions.  She is unaccompanied today to her clinic appointment.      Patient has had anemia for several months with intermittent iron deficiency and has required blood transfusions in the past.  No active signs or symptoms of bleeding at this time.  Review of records and Dr. Jerrel Ivory consult were noted.       Review of Systems   Constitutional: Positive for fatigue. Negative for activity change, appetite change, chills and fever.   HENT: Negative for congestion, mouth sores, sore throat and trouble swallowing.    Respiratory: Negative for shortness of breath.    Cardiovascular: Negative for chest pain.   Gastrointestinal: Negative for abdominal pain, constipation, diarrhea, nausea and vomiting.   Genitourinary: Negative for difficulty urinating, urgency and vaginal pain.   Musculoskeletal: Negative for back pain.   Skin: Negative.    Neurological: Positive for dizziness and light-headedness. Negative for numbness and headaches.   Psychiatric/Behavioral: Negative.    All other systems reviewed and are negative.        Objective:         ? ALPRAZolam XR(+) (XANAX XR) 2 mg tablet Take one tablet by mouth every morning.   ? dicyclomine (BENTYL) 20 mg tablet Take 1 Tab by mouth every 6 hours.   ? duloxetine DR (CYMBALTA) 60 mg capsule Take two capsules by mouth at bedtime daily.   ? eszopiclone (LUNESTA) 3 mg tablet Take one tablet by mouth at bedtime as needed for Sleep.   ? ferrous sulfate (FEOSOL, FEROSUL) 325 mg (65 mg iron) tablet Take 1 Tab by mouth daily.   ? lansoprazole DR (PREVACID) 30 mg capsule Take one capsule by mouth twice daily.   ? lidocaine (LIDODERM) 5 % topical patch Apply one patch topically to affected area every 24 hours. Apply patch for 12 hours, then remove for 12 hours before repeating.   ? methocarbamoL (ROBAXIN) 750 mg tablet Take one tablet by mouth three times daily as needed for Spasms.   ? metoprolol XL (TOPROL XL) 50 mg extended release tablet Take one tablet by mouth daily.   ? ondansetron (ZOFRAN ODT) 4 mg rapid dissolve tablet Dissolve one tablet by mouth every 8 hours as needed for Nausea or Vomiting. Place on tongue to disolve.   ? oxyCODONE (ROXICODONE) 5 mg tablet Take one tablet by mouth every 4 hours as needed for Pain.   ? simvastatin (ZOCOR) 40 mg PO tablet Take one tablet by mouth at bedtime daily.     Vitals:    07/16/21 1103   BP: 110/60   BP Source: Arm, Left Upper   Pulse: 106   Temp: 36.2 ?C (97.2 ?F)   SpO2: 100%   TempSrc: Skin   PainSc: Zero   Weight: 109.2 kg (240 lb 12.8 oz)   Height: 162 cm (5' 3.78)  Comment:  with sandals     Body mass index is 41.62 kg/m?Marland Kitchen     Pain Score: Zero       Fatigue Scale: 7    Pain Addressed:  N/A    Patient Evaluated for a Clinical Trial: No treatment clinical trial available for this patient.     [place] Cooperative Oncology Group performance status is 0, Fully active, able to carry on all pre-disease performance without restriction.Marland Kitchen     Physical Exam  Vitals and nursing note reviewed.   Constitutional:       Appearance: She is well-developed.   HENT:      Mouth/Throat:      Pharynx: No oropharyngeal exudate.   Eyes:      General: No scleral icterus.  Cardiovascular:      Rate and Rhythm: Tachycardia present.   Pulmonary:      Effort: Pulmonary effort is normal.      Breath sounds: Normal breath sounds.   Abdominal:      General: Abdomen is flat.      Palpations: Abdomen is soft.   Musculoskeletal:         General: Normal range of motion.      Cervical back: Normal range of motion and neck supple.   Skin: General: Skin is warm and dry.   Neurological:      Mental Status: She is alert and oriented to person, place, and time.   Psychiatric:         Mood and Affect: Mood normal.               Assessment and Plan:    ICD-9-CM ICD-10-CM    1. Other iron deficiency anemia  280.8 D50.8 CBC AND DIFF      COMPREHENSIVE METABOLIC PANEL      IRON + BINDING CAPACITY + %SAT+ FERRITIN      VITAMIN B12          Problem   Iron Deficiency Anemia    Per consult history:   history of rectal ulcers, with no rectal bleeding for several years. She recently saw her pcp and underwent CBC on 06/02/21 which showed hb 8.3, HCT 26.6, MCV 78, RDW 14.5, WBC 5.0, PLT count 274K.  Differential was normal aside from mild lymphophenia, ALC 645. Iron panel is not available however ferritin level was 9.  Hematology is asked to see the patient.    She tells me she recalls be diagnosed with iron deficiency anemia about 13 years ago.  She received [facility] iron at [facility], tolerated well. She also says she has intermittently received PRBC transfusion about twice a year.     HG 9.0  06/30/21  Component      Latest Ref Rng & Units 06/30/2021           2:11 PM   Iron      50 - 160 MCG/DL 15 (L)   Iron Binding-TIBC      270 - 380 MCG/DL 454 (H)   % Saturation      28 - 42 % 3 (L)   Ferritin      10 - 200 NG/ML 4 (L)            Iron deficiency anemia  55 year old patient who was consulted by Dr. Carolina Sink for iron deficient anemia.  Patient is here today to receive Venofer 200 mg IV.  Patient states she has taken iron infusions in the past and has tolerated them well.  She believes  she had a iron sucrose in the past but does not recall.  She also recalls that her grandmother had some kind of hematology disorder but since she has passed and her parents have passed she has no one to contact to find out any further history updates.    Today we reviewed all of the lab work that was performed on 06/30/2021 to include a CBC, CMP, B12, folic acid, iron studies, SPEP, and reticulocyte count.  With the exception of her iron studies and hemoglobin of 9.0 her SPEP was normal, no paraprotein seen.  Chemistry profile was essentially normal and reticulocyte count was normal.      Educated patient today on new therapy, Venofer.  I have provided both verbal and written information on medication to be given and the possible side effects of therapy.  We have discussed management of the side effects and I have provided them with the phone number [phone] and contact to call if needed.  I have allowed questions and have answered them to their satisfaction.   Patient verbalized understanding.  She will receive Venofer 200 mg IV weekly for 5 weeks.  Follow-up in approximately 2 months with repeat lab.  Patient verbalized understanding and verbal consent obtained          No visits with results within 1 Day(s) from this visit.   Latest known visit with results is:   Office Visit on 06/30/2021   Component Date Value Ref Range Status   ? Immuno Fix-Serum 06/30/2021 NO PARAPROTEIN SEEN   Final   ? Pathologist Signature 06/30/2021    Final                    Value:INTERPRETED BY Zachery Dakins YE M.D.  By the PATH SIGNATURE ABOVE, I attest that I have personally formulated the   final interpretation expressed in this report and that the above diagnosis is   based upon my examination of the slides and/or other material indicated in this   report.     ? Total Protein-SEP 06/30/2021 6.2  6.0 - 8.0 G/DL Final   ? Albumin % 56/21/3086 59.1  48 - 68 % Final   ? Alpha 1 % 06/30/2021 4.7  2 - 6 % Final   ? Alpha 2 % 06/30/2021 11.4  5 - 15 % Final   ? Beta %,Serum 06/30/2021 14.1  9 - 17 % Final   ? Gamma % 06/30/2021 10.7  9 - 21 % Final   ? Interpretation - SEP 06/30/2021 NORMAL ELECTROPHORETIC PATTERN   Final   ? Pathologist Signature 06/30/2021    Final                    Value:INTERPRETED BY Zachery Dakins YE M.D.  By the PATH SIGNATURE ABOVE, I attest that I have personally formulated the   final interpretation expressed in this report and that the above diagnosis is   based upon my examination of the slides and/or other material indicated in this   report.     ? Retic, Uncorrected 06/30/2021 1.4  0.5 - 2.0 % Final   ? Retic, Corrected 06/30/2021 0.9  % Final   ? Retic, Absolute 06/30/2021 52.2  30 - 94 K/UL Final   ? Vitamin B12 06/30/2021 243  180 - 914 PG/ML Final   ? Serum Folate 06/30/2021 19.7  >3.9 NG/ML Final   ? Iron 06/30/2021 15 (L)  50 - 160 MCG/DL Final   ? Iron Binding-TIBC  06/30/2021 551 (H)  270 - 380 MCG/DL Final   ? % Saturation 06/30/2021 3 (L)  28 - 42 % Final   ? Ferritin 06/30/2021 4 (L)  10 - 200 NG/ML Final   ? White Blood Cells 06/30/2021 6.3  4.5 - 11.0 K/UL Final   ? RBC 06/30/2021 3.73 (L)  4.0 - 5.0 M/UL Final   ? Hemoglobin 06/30/2021 9.0 (L)  12.0 - 15.0 GM/DL Final   ? Hematocrit 16/12/9602 28.0 (L)  36 - 45 % Final   ? MCV 06/30/2021 75.1 (L)  80 - 100 FL Final   ? MCH 06/30/2021 24.2 (L)  26 - 34 PG Final   ? MCHC 06/30/2021 32.2  32.0 - 36.0 G/DL Final   ? RDW 54/11/8117 15.4 (H)  11 - 15 % Final   ? Platelet Count 06/30/2021 306  150 - 400 K/UL Final   ? MPV 06/30/2021 7.7  7 - 11 FL Final   ? Neutrophils 06/30/2021 74  41 - 77 % Final   ? Lymphocytes 06/30/2021 14 (L)  24 - 44 % Final   ? Monocytes 06/30/2021 7  4 - 12 % Final   ? Eosinophils 06/30/2021 4  0 - 5 % Final   ? Basophils 06/30/2021 1  0 - 2 % Final   ? Absolute Neutrophil Count 06/30/2021 4.73  1.8 - 7.0 K/UL Final   ? Absolute Lymph Count 06/30/2021 0.90 (L)  1.0 - 4.8 K/UL Final   ? Absolute Monocyte Count 06/30/2021 0.43  0 - 0.80 K/UL Final   ? Absolute Eosinophil Count 06/30/2021 0.24  0 - 0.45 K/UL Final   ? Absolute Basophil Count 06/30/2021 0.03  0 - 0.20 K/UL Final   ? Sodium 06/30/2021 140  137 - 147 MMOL/L Final   ? Potassium 06/30/2021 3.6  3.5 - 5.1 MMOL/L Final   ? Chloride 06/30/2021 102  98 - 110 MMOL/L Final   ? Glucose 06/30/2021 84  70 - 100 MG/DL Final   ? Blood Urea Nitrogen 06/30/2021 16  7 - 25 MG/DL Final   ? Creatinine 14/78/2956 0.68  0.4 - 1.00 MG/DL Final   ? Calcium 21/30/8657 8.8  8.5 - 10.6 MG/DL Final   ? Total Protein 06/30/2021 6.5  6.0 - 8.0 G/DL Final   ? Total Bilirubin 06/30/2021 0.2 (L)  0.3 - 1.2 MG/DL Final   ? Albumin 84/69/6295 4.0  3.5 - 5.0 G/DL Final   ? Alk Phosphatase 06/30/2021 103  25 - 110 U/L Final   ? AST (SGOT) 06/30/2021 11  7 - 40 U/L Final   ? CO2 06/30/2021 27  21 - 30 MMOL/L Final   ? ALT (SGPT) 06/30/2021 12  7 - 56 U/L Final   ? Anion Gap 06/30/2021 11  3 - 12 Final   ? eGFR 06/30/2021 >60  >60 mL/min Final    eGFR calculated using the CKD-EPIcr_R equation

## 2021-07-16 NOTE — Progress Notes
Patient here for Venofer infusion. Peripheral IV started in patient's right upper arm. Patient tolerated infusion well and showed no signs of hypersensitivity to infusion. Patient was observed for 30 minutes after infusion. Peripheral IV removed and patient discharged in stable condition. Patient ambulated independently out of clinic.

## 2021-07-21 MED ORDER — IRON SUCROSE 100 MG IRON/5 ML IV SOLN GROUP
200 mg | Freq: Once | INTRAVENOUS | 0 refills | Status: AC
Start: 2021-07-21 — End: ?

## 2021-07-22 ENCOUNTER — Encounter
Admit: 2021-07-22 | Discharge: 2021-07-22 | Payer: BC Managed Care – PPO | Primary: Student in an Organized Health Care Education/Training Program

## 2021-07-22 DIAGNOSIS — D509 Iron deficiency anemia, unspecified: Secondary | ICD-10-CM

## 2021-07-22 NOTE — Patient Instructions
Call Immediately to report the following:  Uncontrolled nausea and/or vomiting, uncontrolled pain, or unusual bleeding.  Temperature of 100.4 F or greater and/or any sign/symptom of infection (redness, warmth, tenderness)  Painful mouth or difficulty swallowing  Red, cracked, or painful hands and/or feet  Diarrhea   Swelling of arms or legs  Rash     Important Phone Numbers:  [facility] Main Number (answered 24 hours a day) [phone]  Cancer Center Scheduling (appointments) [phone]  [facility] (for nutritional supplements) [phone] For up to date information on the COVID-19 virus, visit the CDC website. https://www.cdc.gov/coronavirus   General supportive care during cold and flu season and infection prevention reminders:    o Wash hands often with soap and water for at least 20 seconds   o Cover your mouth and nose   o Social distancing: try to maintain 6 feet between you and other people   o Stay home if sick and symptoms mild or manageable?  If you must be around people wear a mask     If you are having symptoms of a lower respiratory infection (cough, shortness of breath) and/or fever AND either traveled in last 30 days (internationally or to region of exposure) OR known exposure to patient with COVID19:     o Call your primary care provider for questions or health needs.   Tell your doctor about your recent travel and your symptoms     o In a medical emergency, call 911 or go to the nearest emergency room.

## 2021-07-26 ENCOUNTER — Encounter
Admit: 2021-07-26 | Discharge: 2021-07-26 | Payer: BC Managed Care – PPO | Primary: Student in an Organized Health Care Education/Training Program

## 2021-07-26 ENCOUNTER — Emergency Department
Admit: 2021-07-26 | Discharge: 2021-07-26 | Disposition: A | Discharge status: Home / Self Care | Payer: BC Managed Care – PPO

## 2021-07-26 DIAGNOSIS — R1084 Generalized abdominal pain: Secondary | ICD-10-CM

## 2021-07-26 DIAGNOSIS — R197 Diarrhea, unspecified: Secondary | ICD-10-CM

## 2021-07-26 LAB — COMPREHENSIVE METABOLIC PANEL
CHLORIDE: 106 MMOL/L — ABNORMAL LOW (ref 98–110)
GLUCOSE,PANEL: 179 mg/dL — ABNORMAL HIGH (ref 70–100)
POTASSIUM: 3.6 MMOL/L — ABNORMAL LOW (ref 3.5–5.1)
SODIUM: 143 MMOL/L — ABNORMAL LOW (ref 137–147)

## 2021-07-26 LAB — URINALYSIS MICROSCOPIC REFLEX TO CULTURE

## 2021-07-26 LAB — URINALYSIS DIPSTICK REFLEX TO CULTURE
NITRITE: NEGATIVE K/UL — ABNORMAL HIGH (ref 3–12)
URINE ASCORBIC ACID, UA: NEGATIVE K/UL (ref 0–0.80)
URINE BILE: NEGATIVE U/L (ref 7–40)
URINE BLOOD: NEGATIVE MMOL/L — ABNORMAL HIGH (ref 21–30)
URINE KETONE: NEGATIVE U/L — ABNORMAL LOW (ref 25–110)

## 2021-07-26 LAB — CBC AND DIFF
ABSOLUTE BASO COUNT: 0 K/UL (ref 0–0.20)
ABSOLUTE EOS COUNT: 0.3 K/UL (ref 0–0.45)
WBC COUNT: 4.3 K/UL — ABNORMAL LOW (ref 4.5–11.0)

## 2021-07-26 LAB — POC LACTATE
LACTIC ACID POC: 2.6 MMOL/L — ABNORMAL HIGH (ref 0.5–2.0)
LACTIC ACID POC: 1.7 MMOL/L (ref 0.5–2.0)

## 2021-07-26 MED ORDER — ONDANSETRON HCL (PF) 4 MG/2 ML IJ SOLN
4 mg | INTRAVENOUS | 0 refills | Status: DC | PRN
Start: 2021-07-26 — End: 2021-07-26
  Administered 2021-07-26: 16:00:00 4 mg via INTRAVENOUS

## 2021-07-26 MED ORDER — MORPHINE 2 MG/ML IV SYRG
2 mg | Freq: Once | INTRAVENOUS | 0 refills | Status: CP
Start: 2021-07-26 — End: ?
  Administered 2021-07-26: 16:00:00 2 mg via INTRAVENOUS

## 2021-07-26 MED ORDER — SODIUM CHLORIDE 0.9 % IV SOLP
1000 mL | INTRAVENOUS | 0 refills | Status: CP
Start: 2021-07-26 — End: ?
  Administered 2021-07-26: 16:00:00 1000 mL via INTRAVENOUS

## 2021-07-26 MED ORDER — MORPHINE 2 MG/ML IV SYRG
2-4 mg | INTRAVENOUS | 0 refills | Status: DC | PRN
Start: 2021-07-26 — End: 2021-07-26

## 2021-07-26 NOTE — ED Notes
DC instructions reviewed with pt and pt verbalizes understanding. PIV removed and pt discharged ambulating independently to POV.

## 2021-07-28 ENCOUNTER — Encounter
Admit: 2021-07-28 | Discharge: 2021-07-28 | Payer: BC Managed Care – PPO | Primary: Student in an Organized Health Care Education/Training Program

## 2021-08-03 ENCOUNTER — Encounter
Admit: 2021-08-03 | Discharge: 2021-08-03 | Payer: BC Managed Care – PPO | Primary: Student in an Organized Health Care Education/Training Program

## 2021-08-06 ENCOUNTER — Encounter
Admit: 2021-08-06 | Discharge: 2021-08-06 | Payer: BC Managed Care – PPO | Primary: Student in an Organized Health Care Education/Training Program

## 2021-08-11 ENCOUNTER — Encounter: Admit: 2021-08-11 | Discharge: 2021-08-11 | Primary: Student in an Organized Health Care Education/Training Program

## 2021-08-21 ENCOUNTER — Encounter
Admit: 2021-08-21 | Discharge: 2021-08-21 | Payer: MEDICAID | Primary: Student in an Organized Health Care Education/Training Program

## 2021-08-21 ENCOUNTER — Emergency Department: Admit: 2021-08-21 | Discharge: 2021-08-21 | Payer: MEDICAID

## 2021-08-21 DIAGNOSIS — K5790 Diverticulosis of intestine, part unspecified, without perforation or abscess without bleeding: Secondary | ICD-10-CM

## 2021-08-21 DIAGNOSIS — R1032 Left lower quadrant pain: Secondary | ICD-10-CM

## 2021-08-21 LAB — COMPREHENSIVE METABOLIC PANEL: SODIUM: 143 MMOL/L — ABNORMAL LOW (ref 137–147)

## 2021-08-21 LAB — PTT (APTT): PTT: 35 s (ref 24.0–36.5)

## 2021-08-21 LAB — URINALYSIS DIPSTICK REFLEX TO CULTURE
LEUKOCYTES: NEGATIVE K/UL (ref 3–12)
NITRITE: NEGATIVE U/L (ref 7–56)
URINE ASCORBIC ACID, UA: NEGATIVE mL/min — ABNORMAL LOW (ref 1.0–4.8)
URINE BILE: NEGATIVE U/L — ABNORMAL HIGH (ref 25–110)
URINE BLOOD: NEGATIVE U/L (ref 7–40)

## 2021-08-21 LAB — CBC AND DIFF
ABSOLUTE BASO COUNT: 0 K/UL (ref 0–0.20)
ABSOLUTE EOS COUNT: 0.1 K/UL (ref 0–0.45)
ABSOLUTE MONO COUNT: 0.2 K/UL (ref 0–0.80)
MDW (MONOCYTE DISTRIBUTION WIDTH): 17 (ref <20.7)
WBC COUNT: 4.2 K/UL — ABNORMAL LOW (ref 4.5–11.0)

## 2021-08-21 LAB — PROTIME INR (PT): PROTIME: 11 s — ABNORMAL LOW (ref 9.5–14.2)

## 2021-08-21 LAB — LIPASE: LIPASE: 19 U/L — ABNORMAL LOW (ref 11–82)

## 2021-08-21 LAB — POC CREATININE, RAD: CREATININE, POC: 0.6 mg/dL (ref 0.4–1.00)

## 2021-08-21 LAB — SED RATE: ESR: 46 mm/h — ABNORMAL HIGH (ref 0–30)

## 2021-08-21 LAB — TSH WITH FREE T4 REFLEX: TSH: 2.8 uU/mL — ABNORMAL LOW (ref 0.35–5.00)

## 2021-08-21 LAB — URINALYSIS MICROSCOPIC REFLEX TO CULTURE

## 2021-08-21 LAB — MAGNESIUM: MAGNESIUM: 1.7 mg/dL (ref 1.6–2.6)

## 2021-08-21 LAB — C REACTIVE PROTEIN (CRP): C-REACTIVE PROTEIN: 0.4 mg/dL (ref <1.0)

## 2021-08-21 MED ORDER — MORPHINE 4 MG/ML IV SYRG
4 mg | Freq: Once | INTRAVENOUS | 0 refills | Status: CP
Start: 2021-08-21 — End: ?
  Administered 2021-08-21: 18:00:00 4 mg via INTRAVENOUS

## 2021-08-21 MED ORDER — ONDANSETRON HCL (PF) 4 MG/2 ML IJ SOLN
4 mg | Freq: Once | INTRAVENOUS | 0 refills | Status: CP
Start: 2021-08-21 — End: ?
  Administered 2021-08-21: 20:00:00 4 mg via INTRAVENOUS

## 2021-08-21 MED ORDER — MORPHINE 4 MG/ML IV SYRG
4 mg | Freq: Once | INTRAVENOUS | 0 refills | Status: CP
Start: 2021-08-21 — End: ?
  Administered 2021-08-21: 20:00:00 4 mg via INTRAVENOUS

## 2021-08-21 MED ORDER — IOHEXOL 350 MG IODINE/ML IV SOLN
100 mL | Freq: Once | INTRAVENOUS | 0 refills | Status: CP
Start: 2021-08-21 — End: ?
  Administered 2021-08-21: 19:00:00 100 mL via INTRAVENOUS

## 2021-08-21 MED ORDER — SODIUM CHLORIDE 0.9 % IJ SOLN
50 mL | Freq: Once | INTRAVENOUS | 0 refills | Status: CP
Start: 2021-08-21 — End: ?
  Administered 2021-08-21: 19:00:00 50 mL via INTRAVENOUS

## 2021-08-21 NOTE — ED Provider Notes
Patient name: Tanya French  Date of birth: 1965/10/02   Date of encounter: 08/21/2021      Chief Complaint  Chief Complaint   Patient presents with   ? Abdominal pain     LLQ abdominal pain and rectal pain x 2-3 days; diarrhea, nausea- took zofran around 0800 this AM; hx of ulcerative colitis and diverticulitis; denies fevers, chills, CP, SOA        History of Present Illness  Tanya French is a 56 y.o. female, with a history of ulcerative colitis, sigmoid diverticulosis, diverticulitis, rectal ulcer, proctitis, rectovaginal fistula, periumbilical hernia, GERD, hepatic steatosis, sepsis, anemia, HLD, s/p appendectomy, s/p cholecystectomy, s/p hysterectomy who presents to the emergency department for abdominal pain. 2-3 days ago the pt began endorsing lower abdominal pain and has gradually developed rectal pain, diarrhea, blood in stool, and nausea as well. Rectal pain is described as stabbing, throbbing, and pressure-like. She reports this feels similar to previous diverticulitis, but that she has never experienced rectal pain like this before. Endorses use of zofran about every 7 hours and tramadol with relief. Denies foreign bodies in rectum. Also denies fever, chills, chest pain, SOB, flank pain, upper abdominal pain, vomiting, dysuria, frequency. Reports last colonoscopy 1 year ago, no records available at this time. Denies tobacco use since cessation in 2018, and reports rare ETOH use. No history of hemorrhoids. When asked about her recent admission at [facility] 6[facility]4-[facility]5 for diarrhea, nausea, and abdominal pain, pt states she was unsatisfied with care and no imaging was performed; however, mutliple abdominal CTs were performed but the only abnormal result reads There is colonic diverticulosis without evidence of acute diverticulitis. Infiltration of the submucosa of the colon with fat is identified along the right colon and transverse colon.Marland Kitchen       History provided by:  Patient  Language interpreter used: No           Review of Systems  Review of Systems   Constitutional: Negative for chills and fever.   HENT: Negative for sore throat.    Eyes: Negative for visual disturbance.   Respiratory: Negative for shortness of breath.    Cardiovascular: Negative for chest pain.   Gastrointestinal: Positive for abdominal pain, blood in stool, diarrhea, nausea and rectal pain. Negative for vomiting.   Genitourinary: Negative for dysuria, flank pain and frequency.   Musculoskeletal: Negative for back pain.   Skin: Negative for rash.   Neurological: Negative for headaches.        Allergies:  Toradol [ketorolac], Keflex [cephalexin], Compazine [prochlorperazine], Droperidol, and Haloperidol lactate     Past Medical History:  Medical History:   Diagnosis Date   ? Anemia    ? Anxiety    ? Cataract    ? GERD (gastroesophageal reflux disease)    ? Hepatic steatosis    ? History of kyphoplasty    ? Hyperlipidemia    ? Periumbilical hernia    ? Rectal ulcer    ? 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 Sigmoid diverticulosis        Past Surgical History:  Surgical History:   Procedure Laterality Date   ? ACL RECONSTRUCTION  2008   ? HYSTERECTOMY  2009    R oophorectomy   ? APPENDECTOMY  2009   ? CHOLECYSTECTOMY  2009   ? OOPHORECTOMY  2010    L oophorectomy   ? CARPAL TUNNEL RELEASE  2012   ? SIGMOIDOSCOPY DIAGNOSTIC  N/A 03/20/2015    Performed by Tim Lair, [facility] at [facility] ENDO   ? SIGMOIDOSCOPY BIOPSY  03/20/2015    Per[facility] by [facility]air[facility] at [facility] END[facility]   ? COLONOSCOPY N/A 08/05/2016    Performed by Tommie Sams, MD at [facility] ENDO   ? COLONOSCOPY BIOPSY  08/05/2016    Performed by Tommie Sams, [facility] at [facility]   ? PLANTAR FASCIA SURGERY         Family History:  Family History   Problem Relation Age of Onset   ? High Cholesterol Mother    ? Hypertension Mother    ? Other Mother         COPD   ? High Cholesterol Father    ? Heart Disease Father    ? Diabetes Father    ? Heart Attack Father    ? Hypertension Father    ? Other Father         arrhythmias   ? Cancer Maternal Grandmother         colon CA at age 46       Social History:  Social History     Tobacco Use   ? Smoking status: Former     Packs/day: 0.50     Years: 20.00     Pack years: 10.00     Types: Cigarettes     Quit date: 12/14/2016     Years since quitting: 4.6     Passive exposure: Never   ? Smokeless tobacco: Never   Vaping Use   ? Vaping Use: Never used   Substance Use Topics   ? Alcohol use: Yes     Comment: social   ? Drug use: No      Social History     Substance and Sexual Activity   Drug Use No        Medical, surgical, family, and social histories reviewed.    Current medications:  Current Outpatient Medications   Medication Instructions   ? ALPRAZolam XR (XANAX XR) 2 mg, Oral, EVERY MORNING   ? dicyclomine (BENTYL) 20 mg, Oral, EVERY  6 HOURS   ? duloxetine DR (CYMBALTA) 120 mg, Oral, AT BEDTIME DAILY   ? eszopiclone (LUNESTA) 3 mg, Oral, AT BEDTIME PRN   ? ferrous sulfate (FEOSOL) 325 mg, Oral, DAILY   ? lansoprazole DR (PREVACID) 30 mg, Oral, TWICE DAILY   ? lidocaine (LIDODERM) 5 % topical patch 1 patch, Topical, EVERY 24 HOURS, Apply patch for 12 hours, then remove for 12 hours before repeating.   ? methocarbamoL (ROBAXIN) 750 mg, Oral, THREE TIMES DAILY PRN   ? metoprolol succinate XL (TOPROL XL) 50 mg, Oral, DAILY   ? ondansetron (ZOFRAN ODT) 4 mg, Oral, EVERY  8 HOURS PRN, Place on tongue to disolve.   ? oxyCODONE (ROXICODONE) 5 mg, Oral, EVERY  4 HOURS PRN   ? simvastatin (ZOCOR) 40 mg, Oral, AT BEDTIME DAILY          Vitals  ED Vitals    Date and Time T BP P RR SPO2P SPO2 User   08/21/21 1727 -- 160/102 -- 16 PER MINUTE 84 94 % ES   08/21/21 1700 -- 161/118 -- -- 86 96 % DB   08/21/21 1600 -- 123/107 -- -- 81 92 % DB   08/21/21 1500 -- 159/100 -- -- 80 91 % DB   08/21/21 1404 -- 157/119 -- -- 80 98 % DB   08/21/21 1330 -- 144/109 -- --  76 94 % DB   08/21/21 1019 37 ?C (98.6 ?F) 136/102 -- 17 PER MINUTE 84 97 % SB Physical Exam  Physical Exam  Vitals and nursing note reviewed. Exam conducted with a chaperone present.   Constitutional:       General: She is not in acute distress.     Appearance: She is normal weight. She is not ill-appearing, toxic-appearing or diaphoretic.      Comments: Appears uncomfortable   HENT:      Head: Normocephalic and atraumatic.   Eyes:      General: No scleral icterus.        Right eye: No discharge.         Left eye: No discharge.      Extraocular Movements: Extraocular movements intact.      Conjunctiva/sclera: Conjunctivae normal.   Cardiovascular:      Rate and Rhythm: Normal rate and regular rhythm.      Pulses: Normal pulses.      Heart sounds: Normal heart sounds. No murmur heard.     No friction rub. No gallop.   Pulmonary:      Effort: Pulmonary effort is normal. No respiratory distress.      Breath sounds: Normal breath sounds. No stridor. No wheezing, rhonchi or rales.   Chest:      Chest wall: No tenderness.   Abdominal:      General: Abdomen is flat. Bowel sounds are normal. There is no distension.      Palpations: Abdomen is soft. There is no mass.      Tenderness: There is abdominal tenderness in the suprapubic area and left lower quadrant. There is no right CVA tenderness, left CVA tenderness, guarding or rebound.      Hernia: No hernia is present.   Genitourinary:     Comments: Patient consented to have a rectal exam performed.  RN chaperone in room throughout the entirety of the exam.  Pink/red-colored mucus visualized.  1 non-bleeding external hemorrhoid at the 6 o'clock position.  Musculoskeletal:         General: No swelling, tenderness, deformity or signs of injury. Normal range of motion.      Cervical back: Neck supple. No rigidity or tenderness.      Right lower leg: No edema.      Left lower leg: No edema.   Skin:     General: Skin is warm and dry.      Capillary Refill: Capillary refill takes less than 2 seconds.      Coloration: Skin is not jaundiced or pale. Findings: No bruising, erythema, lesion or rash.   Neurological:      General: No focal deficit present.      Mental Status: She is alert and oriented to person, place, and time. Mental status is at baseline.   Psychiatric:         Mood and Affect: Mood normal.         Behavior: Behavior normal.         Thought Content: Thought content normal.         Labs/Radiology/Other Diagnostic Tests  Labs Reviewed   CBC AND DIFF - Abnormal       Result Value Ref Range Status    White Blood Cells 4.2 (*) 4.5 - 11.0 K/UL Final    RBC 3.55 (*) 4.0 - 5.0 M/UL Final    Hemoglobin 8.8 (*) 12.0 - 15.0 GM/DL Final    Hematocrit 16.1 (*) 36 -  45 % Final    MCV 78.7 (*) 80 - 100 FL Final    MCH 24.7 (*) 26 - 34 PG Final    MCHC 31.4 (*) 32.0 - 36.0 G/DL Final    RDW 16.1 (*) 11 - 15 % Final    Platelet Count 295  150 - 400 K/UL Final    MPV 7.1  7 - 11 FL Final    Neutrophils 75  41 - 77 % Final    Lymphocytes 14 (*) 24 - 44 % Final    Monocytes 7  4 - 12 % Final    Eosinophils 3  0 - 5 % Final    Basophils 1  0 - 2 % Final    Absolute Neutrophil Count 3.22  1.8 - 7.0 K/UL Final    Absolute Lymph Count 0.59 (*) 1.0 - 4.8 K/UL Final    Absolute Monocyte Count 0.27  0 - 0.80 K/UL Final    Absolute Eosinophil Count 0.13  0 - 0.45 K/UL Final    Absolute Basophil Count 0.03  0 - 0.20 K/UL Final    MDW (Monocyte Distribution Width) 17.4  <20.7 Final   COMPREHENSIVE METABOLIC PANEL - Abnormal    Sodium 143  137 - 147 MMOL/L Final    Potassium 3.8  3.5 - 5.1 MMOL/L Final    Chloride 106  98 - 110 MMOL/L Final    Glucose 115 (*) 70 - 100 MG/DL Final    Blood Urea Nitrogen 6 (*) 7 - 25 MG/DL Final    Creatinine 0.96  0.4 - 1.00 MG/DL Final    Calcium 8.6  8.5 - 10.6 MG/DL Final    Total Protein 6.7  6.0 - 8.0 G/DL Final    Total Bilirubin 0.2 (*) 0.3 - 1.2 MG/DL Final    Albumin 3.8  3.5 - 5.0 G/DL Final    Alk Phosphatase 112 (*) 25 - 110 U/L Final    AST (SGOT) 14  7 - 40 U/L Final    CO2 27  21 - 30 MMOL/L Final    ALT (SGPT) 16  7 - 56 U/L Final    Anion Gap 10  3 - 12 Final    eGFR >60  >60 mL/min Final   SED RATE - Abnormal    Sed Rate -ESR 46 (*) 0 - 30 MM/HR Final   LIPASE    Lipase 19  11 - 82 U/L Final   TSH WITH FREE T4 REFLEX    TSH 2.89  0.35 - 5.00 MCU/ML Final   URINALYSIS DIPSTICK REFLEX TO CULTURE    Color,UA YELLOW   Final    Turbidity,UA CLEAR  CLEAR-CLEAR Final    Specific Gravity-Urine 1.018  1.005 - 1.030 Final    pH,UA 5.0  5.0 - 8.0 Final    Protein,UA NEG  NEG-NEG Final    Glucose,UA NEG  NEG-NEG Final    Ketones,UA NEG  NEG-NEG Final    Bilirubin,UA NEG  NEG-NEG Final    Blood,UA NEG  NEG-NEG Final    Urobilinogen,UA NORMAL  NORM-NORMAL Final    Nitrite,UA NEG  NEG-NEG Final    Leukocytes,UA NEG  NEG-NEG Final    Urine Ascorbic Acid, UA NEG  NEG-NEG Final   URINALYSIS MICROSCOPIC REFLEX TO CULTURE    WBCs,UA 0-2  0 - 2 /HPF Final    RBCs,UA 0-2  0 - 3 /HPF Final    Comment,UA     Final  Value: Criteria for reflex to culture are WBC>10, Positive Nitrite, and/or >=+1   leukocytes. If quantity is not sufficient, an addendum will follow.      MucousUA TRACE   Final    Squamous Epithelial Cells 0-2  0 - 5 Final    Hyaline Cast 0-2   Final   MAGNESIUM    Magnesium 1.7  1.6 - 2.6 mg/dL Final   PROTIME INR (PT)    Protime 11.7  9.5 - 14.2 SEC Final    INR 1.1  0.8 - 1.2 Final   PTT (APTT)    APTT 35.9  24.0 - 36.5 SEC Final   POC CREATININE, RAD    Creatinine, POC 0.6  0.4 - 1.00 MG/DL Final   C REACTIVE PROTEIN (CRP)    C-Reactive Protein 0.40  <1.0 MG/DL Final   UA GREY TOP TUBE   CLEAR TOP EXTRA URINE TUBE            CT ABD/PELV W CONTRAST   Final Result      1. Diverticulosis without diverticulitis.   2. No bowel obstruction or abnormal bowel wall thickening.   3. No acute inflammatory process in the abdomen or pelvis.          Finalized by Jaci Carrel, MD on 08/21/2021 2:08 PM. Dictated by Jaci Carrel, MD on 08/21/2021 2:03 PM.               ECG:          Medical Decision Making:  Rebekka Burak is a 56 y.o. female patient who presents with chief complaint as listed above. Based on the history and presentation, the list of differential diagnoses considered included, but was not limited to, diverticulitis, ulcerative colitis flare, perirectal abscess, urinary tract infection    ED Course  Vitals show hypertension.  Other vital signs within normal limits.    Labs demonstrate persistent leukopenia and anemia.  Normal platelet count.  Normal electrolytes and kidney function.  Clinically normal liver function tests.  Normal coags.  Normal lipase.  Normal CRP.  Mildly elevated ESR.  No UTI and urinalysis.    Imaging demonstrates diverticulosis without diverticulitis.  No bowel obstruction or abnormal bowel wall thickening.  No acute inflammatory process in the abdomen or pelvis.    This is a 56 year old female patient with reported history of diverticulitis and ulcerative colitis who presents to the ED with left lower quadrant abdominal pain and rectal pain.  On exam, she appeared slightly uncomfortable.  Left lower quadrant tenderness and slight suprapubic abdominal tenderness to palpation.  No signs of acute abdomen.  On rectal exam, there is pink/red-tinged mucus.    Patient given morphine x2 and Zofran x1 for symptomatic relief.    Spoke to gastroenterology.  They state due to her reassuring labs including normal CRP and relatively normal ESR and reassuring CT impression, they do not believe this is a UC flare.  They do state they would like to see the patient in clinic.  Message sent to GI fellow to arrange follow-up.  Also included GI clinic contact information in the discharge instructions.    Patient updated with all results.  She asked for another dose of pain medication, specifically Dilaudid.  Stated that this was not indicated at this time.  She agrees with plan for discharge home and to follow-up with GI in clinic.  Return precautions issued.        Complexity of Problems Addressed  Patient's active diagnoses as  well as contributing pre-existing medical problems include:  Clinical Impression   Left lower quadrant abdominal pain   Diverticulosis        Evaluation performed for potential threat to life or bodily function during this visit given the initial differential diagnosis and clinical impression(s) as discussed previously in MDM/ED course.    Additional data reviewed:    ? History was obtained from an independent historian: Not in addition to what is mentioned above  ? Prior non-ED notes reviewed: Outside ED record  ? Independent interpretation of diagnostic tests was performed by me: Not in addition to what is mentioned above  ? Patient presentation/management was discussed with the following qualified health care professionals and/or other relevant professionals: Not in addition to what is mentioned above    Risk evaluation:    ? Diagnosis or treatment of patient condition impacted by social determinant of health: None  ? Tests Considered but not performed due to clinical scoring (if not mentioned in ED course, aside from what is implied by clinical scores listed): N/A  ? Rationale regarding whether admission or escalation of care considered if not performed (if not mentioned in ED course, aside from what is implied by clinical scores listed): N/A        ED Course:    Facility Administered Meds  Facility-Administered Medications as of 08/21/2021   Medication Last Admin   ? [COMPLETED] iohexoL (OMNIPAQUE-350) 350 mg/mL injection 100 mL 100 mL at 08/21/21 1359   ? [COMPLETED] morphine injection 4 mg 4 mg at 08/21/21 1305   ? [COMPLETED] morphine injection 4 mg 4 mg at 08/21/21 1439   ? [COMPLETED] ondansetron (ZOFRAN) injection 4 mg 4 mg at 08/21/21 1440   ? [COMPLETED] sodium chloride PF 0.9% injection 50 mL 50 mL at 08/21/21 1359     Scheduled Meds:Continuous Infusions:  PRN and Respiratory Meds:       Discharge Follow Up  Apolinar Junes, MD  [address] [place] [address]  [phone]    Call in 2 days      Gastroenterology: [facility]  [address]  [address]  [place] [address]  [phone]  Call in 2 days      Emergency Department: [facility] [place]   [address]  [address]  [place] [address]  [phone]    As needed, If symptoms worsen       Medications   Discharge Medication List as of 08/21/2021  5:21 PM          Procedures  Procedures    Clinical Impression  Clinical Impression   Left lower quadrant abdominal pain   Diverticulosis        Disposition  ED Disposition     ED Disposition   Discharge          I, Merideth Abbey, am scribing for and in the presence of Christeen Douglas, DO.    Glean Salen, DO  Emergency Medicine Resident Physician PGY-2  [place] [phone]

## 2021-08-21 NOTE — ED Notes
Dc only by this RN     Pt d/c home  . D/c instructions reviewed with pt  Discussed in detail signs and symptoms , daily care recommendations and limitations,pain management and follow up.  Understanding verbalized.via teach back method. All questions and concerns addressed.   Pt discharge approved by Dr

## 2021-08-23 ENCOUNTER — Encounter
Admit: 2021-08-23 | Discharge: 2021-08-23 | Payer: MEDICAID | Primary: Student in an Organized Health Care Education/Training Program

## 2021-08-23 DIAGNOSIS — K625 Hemorrhage of anus and rectum: Secondary | ICD-10-CM

## 2021-08-23 DIAGNOSIS — R109 Unspecified abdominal pain: Secondary | ICD-10-CM

## 2021-08-26 ENCOUNTER — Encounter
Admit: 2021-08-26 | Discharge: 2021-08-26 | Payer: MEDICAID | Primary: Student in an Organized Health Care Education/Training Program

## 2021-08-27 ENCOUNTER — Encounter
Admit: 2021-08-27 | Discharge: 2021-08-27 | Payer: MEDICAID | Primary: Student in an Organized Health Care Education/Training Program

## 2021-08-27 NOTE — Telephone Encounter
Attempted to call patient to set up appointment with Dr. Vella Redhead. No answer. Left vague voicemail on unidentified VM for call back to set up appointment.    Attempted call next day with no response as well. Dr. Vella Redhead aware. Referral and request for appointment still present.

## 2021-09-01 ENCOUNTER — Encounter
Admit: 2021-09-01 | Discharge: 2021-09-01 | Payer: MEDICAID | Primary: Student in an Organized Health Care Education/Training Program

## 2021-09-01 ENCOUNTER — Emergency Department: Admit: 2021-09-01 | Discharge: 2021-09-01 | Disposition: A | Discharge status: Home / Self Care | Payer: MEDICAID

## 2021-09-01 DIAGNOSIS — R109 Unspecified abdominal pain: Secondary | ICD-10-CM

## 2021-09-01 DIAGNOSIS — F6810 Factitious disorder, unspecified: Secondary | ICD-10-CM

## 2021-09-01 DIAGNOSIS — R000 Tachycardia, unspecified: Secondary | ICD-10-CM

## 2021-09-01 LAB — CBC AND DIFF
ABSOLUTE BASO COUNT: 0 K/UL (ref 0–0.20)
ABSOLUTE EOS COUNT: 0.1 K/UL (ref 0–0.45)
ABSOLUTE LYMPH COUNT: 1 K/UL (ref 1.0–4.8)
ABSOLUTE MONO COUNT: 0.7 K/UL (ref 0–0.80)
ABSOLUTE NEUTROPHIL: 7.1 K/UL — ABNORMAL HIGH (ref 1.8–7.0)
BASOPHILS %: 1 % (ref 0–2)
EOSINOPHILS %: 1 % (ref >60)
HEMATOCRIT: 35 % — ABNORMAL LOW (ref 36–45)
HEMOGLOBIN: 11 g/dL — ABNORMAL LOW (ref 12.0–15.0)
LYMPHOCYTES %: 11 % — ABNORMAL LOW (ref 24–44)
MCH: 25 pg — ABNORMAL LOW (ref 26–34)
MCV: 78 FL — ABNORMAL LOW (ref 80–100)
MDW (MONOCYTE DISTRIBUTION WIDTH): 16 (ref <20.7)
MONOCYTES %: 8 % — ABNORMAL HIGH (ref 4–12)
MPV: 7.9 FL (ref 7–11)
NEUTROPHILS %: 79 % — ABNORMAL HIGH (ref 41–77)
PLATELET COUNT: 382 K/UL — ABNORMAL HIGH (ref 150–400)
RBC COUNT: 4.5 M/UL — ABNORMAL HIGH (ref 4.0–5.0)
RDW: 19 % — ABNORMAL HIGH (ref 11–15)
WBC COUNT: 9.1 K/UL (ref 4.5–11.0)

## 2021-09-01 LAB — COMPREHENSIVE METABOLIC PANEL
POTASSIUM: 3.9 MMOL/L (ref 3.5–5.1)
SODIUM: 139 MMOL/L (ref 137–147)

## 2021-09-01 LAB — POC CREATININE, RAD: CREATININE, POC: 0.7 mg/dL (ref 0.4–1.00)

## 2021-09-01 LAB — PROCALCITONIN: PROCALCITONIN: 0 ng/mL (ref 25–110)

## 2021-09-01 LAB — POC LACTATE: LACTIC ACID POC: 3.7 MMOL/L — ABNORMAL HIGH (ref 0.5–2.0)

## 2021-09-01 LAB — LIPASE: LIPASE: 22 U/L (ref 11–82)

## 2021-09-01 MED ORDER — LACTATED RINGERS IV SOLP
1000 mL | INTRAVENOUS | 0 refills | Status: DC
Start: 2021-09-01 — End: 2021-09-01

## 2021-09-01 MED ORDER — DICYCLOMINE 10 MG PO CAP
10 mg | Freq: Once | ORAL | 0 refills | Status: DC
Start: 2021-09-01 — End: 2021-09-01

## 2021-09-01 MED ORDER — DICYCLOMINE 10 MG/ML IM SOLN
10 mg | Freq: Once | INTRAMUSCULAR | 0 refills | Status: DC
Start: 2021-09-01 — End: 2021-09-01

## 2021-09-01 MED ORDER — LACTATED RINGERS IV SOLP
30 mL/kg | INTRAVENOUS | 0 refills | Status: DC
Start: 2021-09-01 — End: 2021-09-01

## 2021-09-01 MED ORDER — ONDANSETRON HCL (PF) 4 MG/2 ML IJ SOLN
8 mg | Freq: Once | INTRAVENOUS | 0 refills | Status: DC
Start: 2021-09-01 — End: 2021-09-01

## 2021-09-01 NOTE — ED Notes
This RN at bedside to give meds. When pt was informed she was getting Bentyl for pain, she requested this RN take her IV out and said she was going to leave. This RN informed pt of effectiveness of Bentyl for abdominal pain. No evidence of learning, pt states "I'm not going to sit here and get Bentyl. I take Bentyl at home." Dr. Jean Rosenthal notified. Pt refuses to sign AMA form. Pt ambulates out of department with steady gait armed with all belongings.

## 2021-09-01 NOTE — ED Notes
56 y/o female presents to ED22 w/ CC abd pain. The pt states that she has been having lower abd pain and N/V/D x 4 days. She states she is vomiting 4-5 times per day. She reports taking Zofran with some relief. Denies having blood in her stool or emesis. She also reports fevers up to 100.5 F at home, but has been taking Tylenol. Pt afebrile upon arrival. Hx diverticulitis and UC. No other complaints identified at this time.    The pt is A&Ox4. Skin is warm, dry, and appropriate for ethnicity. The pt is resting with cart in lowest locked position and call light in reach.    Medical History:   Diagnosis Date    Anemia     Anxiety     Cataract     GERD (gastroesophageal reflux disease)     Hepatic steatosis     History of kyphoplasty     Hyperlipidemia     Periumbilical hernia     Rectal ulcer     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    s/p flap in June 2014    Sigmoid diverticulosis

## 2021-09-13 ENCOUNTER — Encounter
Admit: 2021-09-13 | Discharge: 2021-09-13 | Payer: MEDICAID | Primary: Student in an Organized Health Care Education/Training Program

## 2021-09-21 ENCOUNTER — Encounter
Admit: 2021-09-21 | Discharge: 2021-09-21 | Payer: MEDICAID | Primary: Student in an Organized Health Care Education/Training Program

## 2021-09-29 ENCOUNTER — Encounter: Admit: 2021-09-29 | Discharge: 2021-09-29 | Primary: Student in an Organized Health Care Education/Training Program

## 2021-10-01 ENCOUNTER — Encounter: Admit: 2021-10-01 | Discharge: 2021-10-01 | Primary: Student in an Organized Health Care Education/Training Program

## 2021-10-13 ENCOUNTER — Encounter
Admit: 2021-10-13 | Discharge: 2021-10-13 | Payer: Medicaid Other | Primary: Student in an Organized Health Care Education/Training Program

## 2021-10-15 ENCOUNTER — Encounter
Admit: 2021-10-15 | Discharge: 2021-10-15 | Payer: Medicaid Other | Primary: Student in an Organized Health Care Education/Training Program

## 2021-10-19 ENCOUNTER — Encounter
Admit: 2021-10-19 | Discharge: 2021-10-19 | Payer: Medicaid Other | Primary: Student in an Organized Health Care Education/Training Program

## 2021-10-29 ENCOUNTER — Encounter
Admit: 2021-10-29 | Discharge: 2021-10-29 | Payer: Medicaid Other | Primary: Student in an Organized Health Care Education/Training Program

## 2021-11-10 ENCOUNTER — Encounter
Admit: 2021-11-10 | Discharge: 2021-11-10 | Payer: Medicaid Other | Primary: Student in an Organized Health Care Education/Training Program

## 2021-11-10 ENCOUNTER — Emergency Department: Admit: 2021-11-10 | Discharge: 2021-11-10 | Discharge status: Against Medical Advice | Payer: Medicaid Other

## 2021-11-10 DIAGNOSIS — K5730 Diverticulosis of large intestine without perforation or abscess without bleeding: Secondary | ICD-10-CM

## 2021-11-10 DIAGNOSIS — E785 Hyperlipidemia, unspecified: Secondary | ICD-10-CM

## 2021-11-10 DIAGNOSIS — K626 Ulcer of anus and rectum: Secondary | ICD-10-CM

## 2021-11-10 DIAGNOSIS — F419 Anxiety disorder, unspecified: Secondary | ICD-10-CM

## 2021-11-10 DIAGNOSIS — Z765 Malingerer [conscious simulation]: Secondary | ICD-10-CM

## 2021-11-10 DIAGNOSIS — K219 Gastro-esophageal reflux disease without esophagitis: Secondary | ICD-10-CM

## 2021-11-10 DIAGNOSIS — D649 Anemia, unspecified: Secondary | ICD-10-CM

## 2021-11-10 DIAGNOSIS — K760 Fatty (change of) liver, not elsewhere classified: Secondary | ICD-10-CM

## 2021-11-10 DIAGNOSIS — N823 Fistula of vagina to large intestine: Secondary | ICD-10-CM

## 2021-11-10 DIAGNOSIS — R1032 Left lower quadrant pain: Secondary | ICD-10-CM

## 2021-11-10 DIAGNOSIS — Z9889 Other specified postprocedural states: Secondary | ICD-10-CM

## 2021-11-10 DIAGNOSIS — K429 Umbilical hernia without obstruction or gangrene: Secondary | ICD-10-CM

## 2021-11-10 DIAGNOSIS — H269 Unspecified cataract: Secondary | ICD-10-CM

## 2021-11-10 DIAGNOSIS — R112 Nausea with vomiting, unspecified: Secondary | ICD-10-CM

## 2021-11-10 MED ORDER — ACETAMINOPHEN 500 MG PO TAB
1000 mg | Freq: Once | ORAL | 0 refills | Status: DC
Start: 2021-11-10 — End: 2021-11-10

## 2021-11-10 MED ORDER — LORAZEPAM 2 MG/ML IJ SOLN
1 mg | Freq: Once | INTRAVENOUS | 0 refills | Status: DC
Start: 2021-11-10 — End: 2021-11-10

## 2021-11-10 MED ORDER — DICYCLOMINE 10 MG PO CAP
10 mg | Freq: Once | ORAL | 0 refills | Status: DC
Start: 2021-11-10 — End: 2021-11-10

## 2021-11-10 MED ORDER — LACTATED RINGERS IV SOLP
30 mL/kg | INTRAVENOUS | 0 refills | Status: DC
Start: 2021-11-10 — End: 2021-11-10

## 2021-11-10 MED ORDER — ONDANSETRON HCL (PF) 4 MG/2 ML IJ SOLN
4 mg | Freq: Once | INTRAVENOUS | 0 refills | Status: DC
Start: 2021-11-10 — End: 2021-11-10

## 2021-11-10 NOTE — ED Notes
Patient's Discharge Disposition: Left Before Treatment Complete With Notification   Tanya French has been evaluated by a Qualified Medical Provider (physician or advanced practice provider) and informed staff that they desired to leave the Emergency Department. Patient was encouraged to stay and remain to talk with a Qualified Medical Provider. Was patient willing to wait to discuss risks of leaving with a Qualified Medical Provider? no     The risks of leaving and benefits of staying to receive Medical Screening Examination discussed with Patient and refusal form completed. This form was NOT signed by patient and/or their representative.     The stated reason(s) for leaving were: personal reasons.    Tanya French left before treatment complete at 1320. Tanya French encouraged to return to ED for new or worsening symptoms or if they change their mind.    Condition of patient when they left ED: alert & oriented, respirations even and non-labored, and skin tone normal for race, warm and dry.    Means of departure: ambulatory out of ED with steady gait.

## 2021-11-10 NOTE — ED Provider Notes
Tanya French is a 56 y.o. female.    Chief Complaint:  Chief Complaint   Patient presents with   ? Abdominal pain     LLQ sharp abdominal pain x Monday. Hx diverticulitis. Denies fevers.   ? Vomiting       History of Present Illness:  Tanya French is a 56 year old female with past medical history significant for ulcerative colitis, prior diverticulitis, recent C. difficile infection presenting today for evaluation of abdominal pain, nausea/vomiting and diarrhea.  Patient reports Monday evening she began having a sharp left lower quadrant abdominal pain with associated nausea and vomiting.  Symptoms significantly worsen throughout the day yesterday and into this morning.  She also notes approximately 8-9 episodes of diarrhea daily with some trace blood.  She reports this pain feels very similar to prior diverticulitis episodes.  She has taken Zofran at home which seems to help her nausea but she has not taken anything at home for pain.  She denies any fevers, chills, chest pain, shortness of breath, urinary symptoms.  Of note, patient has been seen multiple times at various emergency departments around [place] over the past month with similar complaints and has a pattern of leaving AMA prior to any work-up when IV opioids are not offered to her.          Review of Systems:  Review of Systems   Constitutional: Negative for fever.   HENT: Negative for sore throat.    Eyes: Negative for visual disturbance.   Respiratory: Negative for shortness of breath.    Cardiovascular: Negative for chest pain.   Gastrointestinal: Positive for abdominal pain, diarrhea, nausea and vomiting.   Genitourinary: Negative for dysuria.   Musculoskeletal: Negative for back pain.   Skin: Negative for rash.   Neurological: Negative for headaches.       Allergies:  Toradol [ketorolac], Keflex [cephalexin], Compazine [prochlorperazine], Droperidol, and Haloperidol lactate    Past Medical History:  Medical History: Diagnosis Date   ? Anemia    ? Anxiety    ? Cataract    ? GERD (gastroesophageal reflux disease)    ? Hepatic steatosis    ? History of kyphoplasty    ? Hyperlipidemia    ? Periumbilical hernia    ? Rectal ulcer    ? 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 Sigmoid diverticulosis        Past Surgical History:  Surgical History:   Procedure Laterality Date   ? ACL RECONSTRUCTION  2008   ? HYSTERECTOMY  2009    R oophorectomy   ? APPENDECTOMY  2009   ? CHOLECYSTECTOMY  2009   ? OOPHORECTOMY  2010    L oophorectomy   ? CARPAL TUNNEL RELEASE  2012   ? SIGMOIDOSCOPY DIAGNOSTIC N/A 03/20/2015    [facility] Lair[facility]   ? SIGMOIDOSCOPY BIOPSY  03/20/2015    Performed by Tim Lair, MD at [facility] ENDO   ? COLONOSCOPY N/A 08/05/2016    [facility] by Tommie Sams[facility] at [facility] ENDO   ? COLONOSCOPY BIOPSY  08/05/2016    [facility] by Tommie [facility] at [facility]   ? PLANTAR FASCIA SURGERY         Pertinent medical/surgical history reviewed    Social History:  Social History     Tobacco Use   ? Smoking status: Former     Packs/day: 0.50     Years:  20.00     Additional pack years: 0.00     Total pack years: 10.00     Types: Cigarettes     Quit date: 12/14/2016     Years since quitting: 4.9     Passive exposure: Never   ? Smokeless tobacco: Never   Vaping Use   ? Vaping Use: Never used   Substance Use Topics   ? Alcohol use: Yes     Comment: social   ? Drug use: No     Social History     Substance and Sexual Activity   Drug Use No             Family History:  Family History   Problem Relation Age of Onset   ? High Cholesterol Mother    ? Hypertension Mother    ? Other Mother         COPD   ? High Cholesterol Father    ? Heart Disease Father    ? Diabetes Father    ? Heart Attack Father    ? Hypertension Father    ? Other Father         arrhythmias   ? Cancer Maternal Grandmother         colon CA at age 82       Vitals:  ED Vitals    Date and Time T BP P RR SPO2P SPO2 User   11/10/21 1141 37.3 ?C (99.1 ?F) 151/115 112 18 PER MINUTE -- 100 % GW   11/10/21 1046 36.7 ?C (98 ?F) 176/118 122 19 PER MINUTE -- 100 % VB          Physical Exam:  Physical Exam  Vitals and nursing note reviewed.   Constitutional:       General: She is in acute distress.      Appearance: Normal appearance. She is normal weight.   HENT:      Head: Normocephalic and atraumatic.   Eyes:      Extraocular Movements: Extraocular movements intact.      Conjunctiva/sclera: Conjunctivae normal.   Cardiovascular:      Rate and Rhythm: Regular rhythm. Tachycardia present.   Pulmonary:      Effort: Pulmonary effort is normal.   Abdominal:      General: There is no distension.      Palpations: Abdomen is soft.      Tenderness: There is abdominal tenderness (LLQ and suprapubic).   Musculoskeletal:         General: Normal range of motion.      Cervical back: Neck supple.      Right lower leg: No edema.      Left lower leg: No edema.   Neurological:      General: No focal deficit present.      Mental Status: Mental status is at baseline.   Psychiatric:         Mood and Affect: Mood normal.         Behavior: Behavior normal.         Laboratory Results:  Labs Reviewed   CULTURE-BLOOD W/SENSITIVITY   CULTURE-BLOOD W/SENSITIVITY   CBC AND DIFF   COMPREHENSIVE METABOLIC PANEL   URINALYSIS DIPSTICK REFLEX TO CULTURE   URINALYSIS MICROSCOPIC REFLEX TO CULTURE   UA GREY TOP TUBE   CLEAR TOP EXTRA URINE TUBE   MAGNESIUM   POC LACTATE   POC LACTATE   POC CREATININE  Radiology Interpretation:    CT ABD/PELV W CONTRAST    (Results Pending)         EKG:      Medical Decision Making:  Tanya French is a 56 y.o. female who presents with chief complaint as listed above. Based on the history and presentation, the list of differential diagnoses considered included, but was not limited to, diverticulitis, ulcerative colitis flare, urinary tract infection, electrolyte abnormality, AKI, malingering    ED Course  Patient is a 56 year old female with history of ulcerative colitis, diverticulitis, recent C. difficile infection and pattern of [place] pain seeking IV pain medications who presents today for evaluation of left lower quadrant abdominal pain.  On arrival to the emergency department, patient does appear uncomfortable.  Vital signs reveal hypertension, tachycardia but otherwise no acute abnormalities.  Physical exam does show left lower quadrant and suprapubic tenderness to palpation with dry mucous membranes.  Initially ordered labs as well as CT scan to further evaluate patient's symptoms, however patient eloped from the emergency department when she was offered oral pain medications.       Complexity of Problems Addressed  Patient's active diagnoses as well as contributing pre-existing medical problems include:  Clinical Impression   LLQ pain   Nausea and vomiting, unspecified vomiting type   Drug-seeking behavior          Additional data reviewed:    ? History was obtained from an independent historian: Not in addition to what is mentioned above  ? Prior non-ED notes reviewed: Outside ED record  ? Independent interpretation of diagnostic tests was performed by me: Not in addition to what is mentioned above  ? Patient presentation/management was discussed with the following qualified health care professionals and/or other relevant professionals: Not in addition to what is mentioned above    Risk evaluation:    ? Diagnosis or treatment of patient condition impacted by social determinant of health: None  ? Tests Considered but not performed due to clinical scoring (if not mentioned in ED course, aside from what is implied by clinical scores listed):   ? Rationale regarding whether admission or escalation of care considered if not performed (if not mentioned in ED course, aside from what is implied by clinical scores listed):     ED Scoring:                                Facility Administered Meds:  Medications   lactated ringers infusion (has no administration in time range)   acetaminophen (TYLENOL EXTRA STRENGTH) tablet 1,000 mg (has no administration in time range)   dicyclomine (BENTYL) capsule 10 mg (has no administration in time range)   LORazepam (ATIVAN) injection 1 mg (has no administration in time range)   ondansetron HCL (PF) (ZOFRAN (PF)) injection 4 mg (has no administration in time range)       Clinical Impression:  Clinical Impression   LLQ pain   Nausea and vomiting, unspecified vomiting type   Drug-seeking behavior       Disposition/Follow up  ED Disposition     ED Disposition   LBTC           No follow-up provider specified.    Medications:  New Prescriptions    No medications on file       Procedure Notes:  Procedures       Attestation /  Supervision:      Drue Novel, MD

## 2021-11-10 NOTE — ED Notes
Upon entering patient room to introduce myself to patient, collect medical history, and review provider orders with patient, patient asked what medications were ordered. After I informed the patient that there were oral medications ordered, but before I could state any other medications or orders listed, patient stated that she would not be staying, and began removing VS monitoring equipment. Pt stated "This is a waste of time, I take oral medications at home all the time. I came here for IV medicine." I encouraged patient to stay and complete treatment, but patient did not wish to to listen and departed the ED without further discussion. Drs. Enders and [place] notified.

## 2021-12-16 ENCOUNTER — Encounter
Admit: 2021-12-16 | Discharge: 2021-12-16 | Payer: Medicaid Other | Primary: Student in an Organized Health Care Education/Training Program

## 2022-03-22 ENCOUNTER — Encounter
Admit: 2022-03-22 | Discharge: 2022-03-22 | Payer: Medicaid Other | Primary: Student in an Organized Health Care Education/Training Program

## 2022-03-22 ENCOUNTER — Emergency Department: Admit: 2022-03-22 | Discharge: 2022-03-22 | Discharge status: Against Medical Advice | Payer: Medicaid Other

## 2022-03-22 DIAGNOSIS — R109 Unspecified abdominal pain: Secondary | ICD-10-CM

## 2022-03-22 DIAGNOSIS — D649 Anemia, unspecified: Secondary | ICD-10-CM

## 2022-03-22 LAB — CBC AND DIFF
ABSOLUTE BASO COUNT: 0 K/UL (ref 0–0.20)
ABSOLUTE EOS COUNT: 0 K/UL (ref 0–0.45)
ABSOLUTE MONO COUNT: 0.2 K/UL (ref 0–0.80)
BASOPHILS %: 1 % (ref 0–2)
HEMOGLOBIN: 7.6 g/dL — ABNORMAL LOW (ref 12.0–15.0)
MDW (MONOCYTE DISTRIBUTION WIDTH): 15 (ref <20.7)
RBC COUNT: 3.5 M/UL — ABNORMAL LOW (ref 4.0–5.0)
WBC COUNT: 4.9 K/UL (ref 4.5–11.0)

## 2022-03-22 LAB — URINALYSIS MICROSCOPIC REFLEX TO CULTURE

## 2022-03-22 LAB — COMPREHENSIVE METABOLIC PANEL
ALK PHOSPHATASE: 108 U/L — ABNORMAL LOW (ref 25–110)
CHLORIDE: 107 MMOL/L — ABNORMAL LOW (ref 98–110)
EGFR: >60 mL/min — ABNORMAL LOW (ref >60)

## 2022-03-22 LAB — URINALYSIS DIPSTICK REFLEX TO CULTURE
LEUKOCYTES: NEGATIVE K/UL (ref 1.8–7.0)
URINE SPEC GRAVITY: 1 g/dL — ABNORMAL LOW (ref 1.005–1.030)

## 2022-03-22 LAB — HIGH SENSITIVITY TROPONIN I 0 HOUR: HIGH SENSITIVITY TROPONIN I 0 HOUR: 3 ng/L (ref <12)

## 2022-03-22 LAB — POC CREATININE, RAD: CREATININE, POC: 0.5 mg/dL (ref 0.4–1.00)

## 2022-03-22 MED ORDER — FENTANYL CITRATE (PF) 50 MCG/ML IJ SOLN
50 ug | Freq: Once | INTRAVENOUS | 0 refills | Status: CP
Start: 2022-03-22 — End: ?
  Administered 2022-03-22: 18:00:00 50 ug via INTRAVENOUS

## 2022-03-22 MED ORDER — ONDANSETRON HCL (PF) 4 MG/2 ML IJ SOLN
4 mg | Freq: Once | INTRAVENOUS | 0 refills | Status: CP
Start: 2022-03-22 — End: ?
  Administered 2022-03-22: 18:00:00 4 mg via INTRAVENOUS
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
## 2022-03-22 NOTE — ED Notes
Patient's Discharge Disposition: Left Before Treatment Complete Without Notification   Tanya French has been evaluated by a Qualified Medical Provider (physician or advanced practice provider).    [name] first called at 1345 with no response. A reasonable attempt was made to locate the patient.     A second attempt was made at 1358 with no response. A reasonable attempt was made to locate the patient.     Patient did not notify staff of the intent to leave and staff did not have the opportunity to discuss refusal form.     Provider name: Garvin Fila, NP-APRN was notified of left before treatment complete status at 1359.

## 2022-04-28 ENCOUNTER — Encounter
Admit: 2022-04-28 | Discharge: 2022-04-28 | Payer: Medicaid Other | Primary: Student in an Organized Health Care Education/Training Program

## 2022-05-09 ENCOUNTER — Encounter
Admit: 2022-05-09 | Discharge: 2022-05-09 | Payer: Medicaid Other | Primary: Student in an Organized Health Care Education/Training Program

## 2022-05-16 ENCOUNTER — Encounter
Admit: 2022-05-16 | Discharge: 2022-05-16 | Payer: Medicaid Other | Primary: Student in an Organized Health Care Education/Training Program

## 2022-05-24 ENCOUNTER — Emergency Department: Admit: 2022-05-24 | Discharge: 2022-05-24

## 2022-05-24 ENCOUNTER — Encounter: Admit: 2022-05-24 | Discharge: 2022-05-24 | Primary: Student in an Organized Health Care Education/Training Program

## 2022-05-24 DIAGNOSIS — W19XXXA Unspecified fall, initial encounter: Secondary | ICD-10-CM

## 2022-05-24 MED ORDER — SODIUM CHLORIDE 0.9 % IJ SOLN
50 mL | Freq: Once | INTRAVENOUS | 0 refills | Status: CP
Start: 2022-05-24 — End: ?
  Administered 2022-05-24: 19:00:00 50 mL via INTRAVENOUS

## 2022-05-24 MED ORDER — OXYCODONE 5 MG PO TAB
5 mg | Freq: Once | ORAL | 0 refills | Status: DC
Start: 2022-05-24 — End: 2022-05-24

## 2022-05-24 MED ORDER — FENTANYL CITRATE (PF) 50 MCG/ML IJ SOLN
50 ug | Freq: Once | INTRAVENOUS | 0 refills | Status: CP
Start: 2022-05-24 — End: ?
  Administered 2022-05-24: 17:00:00 50 ug via INTRAVENOUS

## 2022-05-24 MED ORDER — IOHEXOL 350 MG IODINE/ML IV SOLN
100 mL | Freq: Once | INTRAVENOUS | 0 refills | Status: CP
Start: 2022-05-24 — End: ?
  Administered 2022-05-24: 19:00:00 100 mL via INTRAVENOUS

## 2022-05-24 MED ORDER — ONDANSETRON HCL (PF) 4 MG/2 ML IJ SOLN
4 mg | Freq: Once | INTRAVENOUS | 0 refills | Status: CP
Start: 2022-05-24 — End: ?
  Administered 2022-05-24: 17:00:00 4 mg via INTRAVENOUS

## 2022-05-24 MED ORDER — MORPHINE 4 MG/ML IV SYRG
6 mg | Freq: Once | INTRAVENOUS | 0 refills | Status: CP
Start: 2022-05-24 — End: ?
  Administered 2022-05-24: 19:00:00 6 mg via INTRAVENOUS

## 2022-05-24 NOTE — ED Notes
Patient's Discharge Disposition: Left Before Treatment Complete Without Notification   Tanya French has been evaluated by a Qualified Medical Provider (physician or advanced practice provider).    Tanya French was noted to not be in her room at 1450. A reasonable attempt was made to locate the patient. Pt was noted to not be in bathrooms    A second attempt was made at ~1500 with no evidence of pt belongings or pt in room. A reasonable attempt was made to locate the patient.     Patient did not notify staff of the intent to leave and staff did not have the opportunity to discuss refusal form.     Provider name: Dr. Tarri Abernethy was notified of left before treatment complete status at 1525. RN attempted to call pt but no answer. Voicemail with callback number left.

## 2022-05-24 NOTE — ED Provider Notes
Tanya French is a 57 y.o. female.    Chief Complaint:  Chief Complaint   Patient presents with    Genia Hotter down 9 wooden steps, landed on left hip and bottom. Pain noted on left shoulder, left side, hip and knee. Not on blood thinners, denies hitting head       History of Present Illness:  Patient is a 57 year old female who presents to the emergency department with a chief complaint of fall.  Patient reports that she was dropping off a door-when she started walking on the steps and slipped and having a mechanical fall falling down 9 wooden steps.  She reports that she landed on her left hip and left back.  She reports pain in her left shoulder, left side of her chest, left back, and left hip.  She reports the pain in her left hip does radiate down to her left knee.  She denies hitting her head and denies any loss of consciousness.  She denies any neck pain.        Review of Systems:  Review of Systems   Constitutional:  Negative for fever.   HENT:  Negative for sore throat.    Eyes:  Negative for visual disturbance.   Respiratory:  Negative for shortness of breath.    Cardiovascular:  Positive for chest pain.   Gastrointestinal:  Negative for abdominal pain.   Genitourinary:  Negative for dysuria.   Musculoskeletal:  Positive for arthralgias and back pain.   Skin:  Negative for rash.   Neurological:  Negative for headaches.       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 ENDO    SIGMOIDOSCOPY BIOPSY  03/20/2015    Performed by [facility]air, [facility] at [facility] ENDO    COLONOSCOPY N/A 08/05/2016    Per[facility] by Tommie Sams[facility] at [facility] ENDO    COLONOSCOPY BIOPSY  08/05/2016    Performed by Tommie Sams[facility] at [facility] ENDO    PLANTAR FASCIA SURGERY           Social History:  Social History     Tobacco Use    Smoking status: Former     Current packs/day: 0.00     Average packs/day: 0.5 packs/day for 20.0 years (10.0 ttl pk-yrs)     Types: Cigarettes     Start date: 12/14/1996     Quit date: 12/14/2016     Years since quitting: 5.4     Passive  exposure: Never    Smokeless tobacco: Never   Vaping Use    Vaping status: Never Used   Substance Use Topics    Alcohol use: Yes     Comment: social    Drug use: No     Social History     Substance and Sexual Activity   Drug Use No             Family History:  Family History   Problem Relation Age of Onset    High Cholesterol Mother     Hypertension Mother     Other Mother         COPD    High Cholesterol Father     Heart Disease Father     Diabetes Father     Heart Attack Father     Hypertension Father     Other Father         arrhythmias    Cancer Maternal Grandmother         colon CA at age 86       Vitals:  ED Vitals      Date and Time T BP P RR SPO2P SPO2 User   05/24/22 1401 -- 124/82 89 -- -- 93 % JR   05/24/22 1230 -- 119/88 83 -- -- 92 % JG   05/24/22 1135 36.6 ?C (97.8 ?F) 154/97 -- 16 PER MINUTE -- 92 % JR            Physical Exam:  Physical Exam  Vitals and nursing note reviewed.   Constitutional:       Appearance: Normal appearance. She is normal weight.   HENT:      Head: Normocephalic and atraumatic.   Eyes:      Extraocular Movements: Extraocular movements intact.      Conjunctiva/sclera: Conjunctivae normal. Cardiovascular:      Rate and Rhythm: Normal rate and regular rhythm.   Pulmonary:      Effort: Pulmonary effort is normal.      Breath sounds: Normal breath sounds.   Chest:      Chest wall: Tenderness (Left chest wall tenderness to palpation) present.   Abdominal:      General: Bowel sounds are normal. There is no distension.      Palpations: Abdomen is soft.      Tenderness: There is abdominal tenderness (Left lower quadrant tenderness to palpation over iliac crest).   Musculoskeletal:         General: Tenderness (Thoracic and lumbar tenderness to palpation.  Left shoulder tenderness to palpation.  Left femur/left knee tenderness to palpation) present. Normal range of motion.      Cervical back: Normal range of motion and neck supple. No rigidity or tenderness.   Neurological:      Mental Status: She is oriented to person, place, and time. Mental status is at baseline.   Psychiatric:         Mood and Affect: Mood normal.         Behavior: Behavior normal.         Laboratory Results:  Labs Reviewed - No data to display       Radiology Interpretation:  SHOULDER MIN 2 VIEWS LEFT   Final Result         1.  No fracture or acute osseous abnormality. Maintained AC joint and proximal joint.                Finalized by Shanna Cisco, M.D. on 05/24/2022  2:28 PM. Dictated by Shanna Cisco, M.D. on 05/24/2022 2:26 PM.         KNEE 3 VIEWS LEFT   Final Result         1.  No fracture or acute osseous abnormality. Maintained alignment at the hip and the knee.      2.  No significant knee joint effusion.                Finalized by Shanna Cisco, M.D. on 05/24/2022 2:29 PM. Dictated by Shanna Cisco, M.D. on 05/24/2022 2:26 PM.         FEMUR 2 VIEWS LEFT   Final Result         1.  No fracture or acute osseous abnormality. Maintained alignment at the hip and the knee.      2.  No significant knee joint effusion.                Finalized by Shanna Cisco, M.D. on 05/24/2022 2:29 PM. Dictated by Shanna Cisco, M.D. on 05/24/2022 2:26 PM. CT CHEST W CONTRAST   Final Result         CHEST:      1.  No evidence of major acute traumatic injury to the chest.      2.  Mild emphysema.      ABDOMEN AND PELVIS:      No evidence of major acute traumatic injury to the abdomen or pelvis.          Approved by Fortino Sic, MD on 05/24/2022 4:40 PM      By my electronic signature, I attest that I have personally reviewed the images for this examination and formulated the interpretations and opinions expressed in this report          Finalized by Lars Mage, M.D. on 05/24/2022 5:02 PM. Dictated by Fortino Sic, MD on 05/24/2022 2:48 PM.         CT ABD/PELV W CONTRAST   Final Result         CHEST:      1.  No evidence of major acute traumatic injury to the chest.      2.  Mild emphysema.      ABDOMEN AND PELVIS:      No evidence of major acute traumatic injury to the abdomen or pelvis.          Approved by Fortino Sic, MD on 05/24/2022 4:40 PM      By my electronic signature, I attest that I have personally reviewed the images for this examination and formulated the interpretations and opinions expressed in this report          Finalized by Lars Mage, M.D. on 05/24/2022 5:02 PM. Dictated by Fortino Sic, MD on 05/24/2022 2:48 PM.             EKG:      Medical Decision Making:  Tanya French is a 57 y.o. female who presents with chief complaint as listed above. Based on the history and presentation, the list of differential diagnoses considered included, but was not limited to, fracture, dislocation, musculoskeletal strain    ED Course  - Available records were reviewed.  - Vitals were stable.      -Patient presents after a fall falling down 9 steps.  Patient denies hitting her head denies loss of consciousness.  She is not on any blood thinners.  No indication for head CT at this time.  Using [place] C-spine rule, patient  is determined a low risk for any C-spine injury.  Additionally patient is able to range her neck and has no tenderness on evaluation.  CT scans of chest/abdomen/pelvis obtained given diffuse pain.  Additionally x-rays obtained of left shoulder, left knee, and left femur.  Patient additionally given pain medication and Zofran in the emergency department.    1338: Called back into patient's room by RN secondary to patient stating that she has increased pain.  Patient states that she wants something stronger than fentanyl for pain control.  Offered oxycodone orally at this time, patient declining because she was worried that she was going to vomit.  Patient reexamined and still reports midline lumbar and thoracic tenderness to palpation. Will give patient one-time dose of morphine.  Had a long discussion with patient regarding further management of her symptoms, in regards to narcotic.  Discussed with patient that if no traumatic injuries were found, she will not be receiving any more opioid pain medication, and she will not be discharged with narcotics.  Discussed that Tylenol/ibuprofen is what should be utilized for pain control.  Patient seemed to express understanding.     1520: Notified by RN that patient left the emergency department and was witnessed by camera ambulated out with a steady gait.  It was reported that she left the emergency department shortly after receiving the dose of morphine.  Patient left the emergency department before treatment complete.  Due to her leaving the emergency department, I was unable to discuss her x-ray or CT imaging.  Report above.       Complexity of Problems Addressed  Patient's active diagnoses as well as contributing pre-existing medical problems include:  Clinical Impression   Fall, initial encounter          Additional data reviewed:    History was obtained from an independent historian: Not in addition to what is mentioned above  Prior non-ED notes reviewed: Not in addition to what is mentioned above  Independent interpretation of diagnostic tests was performed by me: Not in addition to what is mentioned above  Patient presentation/management was discussed with the following qualified health care professionals and/or other relevant professionals: Not in addition to what is mentioned above    Risk evaluation:    Diagnosis or treatment of patient condition impacted by social determinant of health: None  Tests Considered but not performed due to clinical scoring (if not mentioned in ED course, aside from what is implied by clinical scores listed):   Rationale regarding whether admission or escalation of care considered if not performed (if not mentioned in ED course, aside from what is implied by clinical scores listed):     ED Scoring:                                Facility Administered Meds:  Medications   ondansetron HCL (PF) (ZOFRAN (PF)) injection 4 mg (4 mg Intravenous Given 05/24/22 1220)   fentaNYL citrate PF (SUBLIMAZE) injection 50 mcg (50 mcg Intravenous Given 05/24/22 1220)   morphine injection 6 mg (6 mg Intravenous Given 05/24/22 1354)   iohexoL (OMNIPAQUE-350) 350 mg/mL injection 100 mL (100 mL Intravenous Given 05/24/22 1407)   sodium chloride PF 0.9% injection 50 mL (50 mL Intravenous Given 05/24/22 1407)       Clinical Impression:  Clinical Impression   Fall, initial encounter       Disposition/Follow up  ED Disposition  ED Disposition   LBTC           No follow-up provider specified.    Medications:  Discharge Medication List as of 05/24/2022  3:43 PM          Procedure Notes:  Procedures       Attestation / Supervision:        Jaynie Collins, MD

## 2022-07-04 ENCOUNTER — Encounter: Admit: 2022-07-04 | Discharge: 2022-07-04 | Payer: Medicaid Other

## 2022-07-11 ENCOUNTER — Encounter: Admit: 2022-07-11 | Discharge: 2022-07-11 | Payer: Medicaid Other

## 2022-08-08 ENCOUNTER — Encounter: Admit: 2022-08-08 | Discharge: 2022-08-08

## 2022-08-08 ENCOUNTER — Emergency Department: Admit: 2022-08-08 | Discharge: 2022-08-08

## 2022-08-08 DIAGNOSIS — K5730 Diverticulosis of large intestine without perforation or abscess without bleeding: Secondary | ICD-10-CM

## 2022-08-08 DIAGNOSIS — H269 Unspecified cataract: Secondary | ICD-10-CM

## 2022-08-08 DIAGNOSIS — K429 Umbilical hernia without obstruction or gangrene: Secondary | ICD-10-CM

## 2022-08-08 DIAGNOSIS — K760 Fatty (change of) liver, not elsewhere classified: Secondary | ICD-10-CM

## 2022-08-08 DIAGNOSIS — F419 Anxiety disorder, unspecified: Secondary | ICD-10-CM

## 2022-08-08 DIAGNOSIS — K219 Gastro-esophageal reflux disease without esophagitis: Secondary | ICD-10-CM

## 2022-08-08 DIAGNOSIS — Z9889 Other specified postprocedural states: Secondary | ICD-10-CM

## 2022-08-08 DIAGNOSIS — K626 Ulcer of anus and rectum: Secondary | ICD-10-CM

## 2022-08-08 DIAGNOSIS — N823 Fistula of vagina to large intestine: Secondary | ICD-10-CM

## 2022-08-08 DIAGNOSIS — S92505A Nondisplaced unspecified fracture of left lesser toe(s), initial encounter for closed fracture: Secondary | ICD-10-CM

## 2022-08-08 DIAGNOSIS — R1032 Left lower quadrant pain: Secondary | ICD-10-CM

## 2022-08-08 DIAGNOSIS — D649 Anemia, unspecified: Secondary | ICD-10-CM

## 2022-08-08 DIAGNOSIS — E785 Hyperlipidemia, unspecified: Secondary | ICD-10-CM

## 2022-08-08 DIAGNOSIS — R110 Nausea: Secondary | ICD-10-CM

## 2022-08-08 DIAGNOSIS — K625 Hemorrhage of anus and rectum: Secondary | ICD-10-CM

## 2022-08-08 LAB — URINALYSIS MICROSCOPIC REFLEX TO CULTURE

## 2022-08-08 LAB — LIPASE: LIPASE: 12 U/L — ABNORMAL LOW (ref 11–82)

## 2022-08-08 LAB — CBC AND DIFF
ABSOLUTE BASO COUNT: 0 K/UL (ref 0–0.20)
ABSOLUTE EOS COUNT: 0.1 K/UL (ref 0–0.45)
ABSOLUTE MONO COUNT: 0.3 K/UL (ref 0–0.80)
MDW (MONOCYTE DISTRIBUTION WIDTH): 15 (ref <20.7)
WBC COUNT: 4.9 K/UL (ref 4.5–11.0)

## 2022-08-08 LAB — URINALYSIS DIPSTICK REFLEX TO CULTURE
LEUKOCYTES: NEGATIVE U/L — ABNORMAL HIGH (ref 7–56)
NITRITE: NEGATIVE MMOL/L (ref 21–30)
URINE ASCORBIC ACID, UA: NEGATIVE K/UL — ABNORMAL HIGH (ref 3–12)
URINE BILE: NEGATIVE g/dL (ref 3.5–5.0)
URINE KETONE: NEGATIVE mg/dL (ref 0.2–1.3)

## 2022-08-08 LAB — COMPREHENSIVE METABOLIC PANEL
EGFR: >60 mL/min — ABNORMAL LOW (ref >60)
SODIUM: 152 MMOL/L — ABNORMAL HIGH (ref 137–147)

## 2022-08-08 MED ORDER — OXYCODONE 5 MG PO TAB
5 mg | ORAL_TABLET | ORAL | 0 refills | 6.00000 days | Status: AC | PRN
Start: 2022-08-08 — End: ?

## 2022-08-08 MED ORDER — IOHEXOL 350 MG IODINE/ML IV SOLN
100 mL | Freq: Once | INTRAVENOUS | 0 refills | Status: CP
Start: 2022-08-08 — End: ?
  Administered 2022-08-08: 17:00:00 100 mL via INTRAVENOUS

## 2022-08-08 MED ORDER — ACETAMINOPHEN 500 MG PO TAB
1000 mg | Freq: Once | ORAL | 0 refills | Status: DC
Start: 2022-08-08 — End: 2022-08-08

## 2022-08-08 MED ORDER — OXYCODONE 5 MG PO TAB
5 mg | Freq: Once | ORAL | 0 refills | Status: DC
Start: 2022-08-08 — End: 2022-08-08

## 2022-08-08 MED ORDER — MORPHINE 4 MG/ML IV SYRG
4 mg | Freq: Once | INTRAVENOUS | 0 refills | Status: CP
Start: 2022-08-08 — End: ?
  Administered 2022-08-08: 17:00:00 4 mg via INTRAVENOUS

## 2022-08-08 MED ORDER — ACETAMINOPHEN 500 MG PO TAB
500 mg | ORAL_TABLET | ORAL | 0 refills | Status: AC | PRN
Start: 2022-08-08 — End: ?

## 2022-08-08 MED ORDER — ONDANSETRON HCL (PF) 4 MG/2 ML IJ SOLN
4 mg | Freq: Once | INTRAVENOUS | 0 refills | Status: CP
Start: 2022-08-08 — End: ?
  Administered 2022-08-08: 19:00:00 4 mg via INTRAVENOUS

## 2022-08-08 MED ORDER — OXYCODONE 5 MG PO TAB
5 mg | Freq: Once | ORAL | 0 refills | Status: CP
Start: 2022-08-08 — End: ?
  Administered 2022-08-08: 21:00:00 5 mg via ORAL

## 2022-08-08 MED ORDER — LACTATED RINGERS IV SOLP
1000 mL | INTRAVENOUS | 0 refills | Status: CP
Start: 2022-08-08 — End: ?
  Administered 2022-08-08: 17:00:00 1000 mL via INTRAVENOUS

## 2022-08-08 MED ORDER — FENTANYL CITRATE (PF) 50 MCG/ML IJ SOLN
50 ug | Freq: Once | INTRAVENOUS | 0 refills | Status: CP
Start: 2022-08-08 — End: ?
  Administered 2022-08-08: 20:00:00 50 ug via INTRAVENOUS

## 2022-08-08 MED ORDER — SODIUM CHLORIDE 0.9 % IJ SOLN
50 mL | Freq: Once | INTRAVENOUS | 0 refills | Status: CP
Start: 2022-08-08 — End: ?
  Administered 2022-08-08: 17:00:00 50 mL via INTRAVENOUS

## 2022-08-08 NOTE — ED Notes
Pt came in with c/o abdominal pain in LUQ to LLQ with nausea, vomiting, and blood in stool. Pt states she has hx of UC in which she takes medication as prescribed for. Pt states she has not been able to eat, drink or adequately control her pain at home. PIV established, labs drawn and pt hooked up to vitals.

## 2022-08-12 ENCOUNTER — Emergency Department: Admit: 2022-08-12 | Discharge: 2022-08-12 | Payer: Medicaid Other

## 2022-08-12 ENCOUNTER — Encounter: Admit: 2022-08-12 | Discharge: 2022-08-12 | Payer: Medicaid Other

## 2022-08-12 DIAGNOSIS — K6289 Other specified diseases of anus and rectum: Secondary | ICD-10-CM

## 2022-08-12 DIAGNOSIS — K921 Melena: Secondary | ICD-10-CM

## 2022-08-12 DIAGNOSIS — R1032 Left lower quadrant pain: Secondary | ICD-10-CM

## 2022-08-12 LAB — POC LACTATE: LACTIC ACID POC: 1.5 MMOL/L (ref 0.5–2.0)

## 2022-08-12 LAB — URINALYSIS DIPSTICK REFLEX TO CULTURE
NITRITE: NEGATIVE U/L (ref 7–56)
URINE ASCORBIC ACID, UA: NEGATIVE mL/min — ABNORMAL LOW (ref 1.0–4.8)

## 2022-08-12 LAB — CBC AND DIFF
ABSOLUTE BASO COUNT: 0 K/UL (ref 0–0.20)
ABSOLUTE EOS COUNT: 0.1 K/UL (ref 0–0.45)
ABSOLUTE MONO COUNT: 0.3 K/UL (ref 0–0.80)
MDW (MONOCYTE DISTRIBUTION WIDTH): 15 (ref <20.7)
WBC COUNT: 4.8 K/UL (ref 4.5–11.0)

## 2022-08-12 LAB — URINALYSIS MICROSCOPIC REFLEX TO CULTURE

## 2022-08-12 LAB — IRON + BINDING CAPACITY + %SAT+ FERRITIN
% SATURATION: 3 % — ABNORMAL LOW (ref 28–42)
FERRITIN: 5 ng/mL — ABNORMAL LOW (ref 10–200)
IRON BINDING: 516 ug/dL — ABNORMAL HIGH (ref 270–380)
IRON: 15 ug/dL — ABNORMAL LOW (ref 50–160)

## 2022-08-12 LAB — SED RATE: ESR: 32 mm/h — ABNORMAL HIGH (ref 0–30)

## 2022-08-12 LAB — C REACTIVE PROTEIN (CRP): C-REACTIVE PROTEIN: 0.2 mg/dL (ref <1.0)

## 2022-08-12 LAB — MAGNESIUM: MAGNESIUM: 1.8 mg/dL — ABNORMAL LOW (ref 1.6–2.6)

## 2022-08-12 LAB — COMPREHENSIVE METABOLIC PANEL: SODIUM: 139 MMOL/L — ABNORMAL LOW (ref 137–147)

## 2022-08-12 LAB — LIPASE: LIPASE: 11 U/L — ABNORMAL LOW (ref 11–82)

## 2022-08-12 MED ORDER — SODIUM CHLORIDE 0.9 % IJ SOLN
50 mL | Freq: Once | INTRAVENOUS | 0 refills | Status: CP
Start: 2022-08-12 — End: ?
  Administered 2022-08-12: 16:00:00 50 mL via INTRAVENOUS

## 2022-08-12 MED ORDER — METHYLPREDNISOLONE SOD SUC(PF) 125 MG/2 ML IJ SOLR
125 mg | Freq: Once | INTRAVENOUS | 0 refills | Status: CP
Start: 2022-08-12 — End: ?
  Administered 2022-08-12: 17:00:00 125 mg via INTRAVENOUS

## 2022-08-12 MED ORDER — IOHEXOL 350 MG IODINE/ML IV SOLN
100 mL | Freq: Once | INTRAVENOUS | 0 refills | Status: CP
Start: 2022-08-12 — End: ?
  Administered 2022-08-12: 16:00:00 100 mL via INTRAVENOUS

## 2022-08-12 MED ORDER — HYDROMORPHONE (PF) 2 MG/ML IJ SYRG
1 mg | Freq: Once | INTRAVENOUS | 0 refills | Status: CP
Start: 2022-08-12 — End: ?
  Administered 2022-08-12: 17:00:00 1 mg via INTRAVENOUS

## 2022-08-12 MED ORDER — FENTANYL CITRATE (PF) 50 MCG/ML IJ SOLN
50 ug | INTRAVENOUS | 0 refills | Status: DC | PRN
Start: 2022-08-12 — End: 2022-08-12
  Administered 2022-08-12: 16:00:00 50 ug via INTRAVENOUS

## 2022-08-12 MED ORDER — ONDANSETRON HCL (PF) 4 MG/2 ML IJ SOLN
4 mg | Freq: Once | INTRAVENOUS | 0 refills | Status: CP
Start: 2022-08-12 — End: ?
  Administered 2022-08-12: 16:00:00 4 mg via INTRAVENOUS

## 2022-08-12 MED ORDER — LACTATED RINGERS IV SOLP
1000 mL | INTRAVENOUS | 0 refills | Status: CP
Start: 2022-08-12 — End: ?
  Administered 2022-08-12: 16:00:00 1000 mL via INTRAVENOUS

## 2022-08-12 MED ORDER — HYDROMORPHONE (PF) 2 MG/ML IJ SYRG
1 mg | Freq: Once | INTRAVENOUS | 0 refills | Status: CP
Start: 2022-08-12 — End: ?
  Administered 2022-08-12: 16:00:00 1 mg via INTRAVENOUS

## 2022-08-12 NOTE — ED Provider Notes
Tanya French is a 57 y.o. female.    Chief Complaint:  Chief Complaint   Patient presents with    Abdominal pain     50 mcg fentanyl intranasal and 4 mg zofran ODT given in route by EMS    Melena     Started Sunday bright red blood. Seen on 6/3 for same complaint. Stabbing LLQ pain and stabbing Rectal pain       History of Present Illness:  Tanya French is a 57 y.o. female, with a history of anemia, anxiety, cataract, GERD, hepatic steatosis, kyphoplasty, HLD, periumbilical hernia, rectal ulcer, rectovaginal fistula, and sigmoid diverticulosis who presents to the emergency department for abdominal pain and melena. Per chart review, the patient was recently seen here in the [facility] on 6/3 for complaints of symptoms that included LLQ abdominal pain that radiates into her back, stabbing rectal pain, diarrhea, blood in stool, nausea, and a low-grade fever. The patient states that since being seen at that time, each of these symptoms have seemed to progressively worsen in regards to their severity and is concerned that she may have a more serious medical complication at this time. The patient endorses a past history significant for UC, however, feels as if her current symptoms are more significant as compared to flare ups she has experienced in the past. Further chart review indicates that the patient currently follows with colorectal surgery and her PCP at [facility] for management of her UC and other chronic conditions, endorsing usage of Mesalamine and Bentyl for treatment of her symptoms. The patient presents to the [facility] at this time for further evaluation and treatment of her symptoms.      History provided by:  Patient  Abdominal pain  Associated symptoms: diarrhea, hematochezia and vomiting    Associated symptoms: no chest pain, no dysuria, no fever, no shortness of breath and no sore throat    Melena  Associated symptoms: abdominal pain and vomiting    Associated symptoms: no fever Review of Systems:  Review of Systems   Constitutional:  Negative for fever.   HENT:  Negative for sore throat.    Eyes:  Negative for visual disturbance.   Respiratory:  Negative for shortness of breath.    Cardiovascular:  Negative for chest pain.   Gastrointestinal:  Positive for abdominal pain, blood in stool, diarrhea, hematochezia, rectal pain and vomiting.   Genitourinary:  Negative for dysuria.   Musculoskeletal:  Negative for back pain.   Skin:  Negative for rash.   Neurological:  Negative for headaches.       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    Sigmoid diverticulosis        Past Surgical History:  Surgical History:   Procedure Laterality Date    ACL RECONSTRUCTION  2008    HYSTERECTOMY  2009    R oophorectomy    APPENDECTOMY  2009    CHOLECYSTECTOMY  2009    OOPHORECTOMY  2010    L  oophorectomy    CARPAL TUNNEL RELEASE  2012    SIGMOIDOSCOPY DIAGNOSTIC N/A 03/20/2015    Performed by Tim Lair, MD at [facility] ENDO    SIGMOIDOSCOPY BIOPSY  03/20/2015    Performed by Tim Lair, MD at [facility] ENDO    COLONOSCOPY N/A 08/05/2016    Performed by Tommie Sams, MD at [facility] ENDO    COLONOSCOPY BIOPSY  08/05/2016    Performed by Tommie Sams, MD at [facility] ENDO    PLANTAR FASCIA SURGERY         Pertinent medical/surgical history reviewed  Past Medical History:   Diagnosis Date    Anemia     Anxiety     Cataract     GERD (gastroesophageal reflux disease)     Hepatic steatosis     History of kyphoplasty     Hyperlipidemia     Periumbilical hernia     Rectal ulcer     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    s/p flap in June 2014 Sigmoid diverticulosis      Surgical History:   Procedure Laterality Date    ACL RECONSTRUCTION  2008    HYSTERECTOMY  2009    R oophorectomy    APPENDECTOMY  2009    CHOLECYSTECTOMY  2009    OOPHORECTOMY  2010    L oophorectomy    CARPAL TUNNEL RELEASE  2012    SIGMOIDOSCOPY DIAGNOSTIC N/A 03/20/2015    Performed by Tim Lair, MD at [facility] ENDO    SIGMOIDOSCOPY BIOPSY  03/20/2015    Performed by Tim Lair, MD at [facility] ENDO    COLONOSCOPY N/A 08/05/2016    Performed by Tommie Sams, MD at [facility] ENDO    COLONOSCOPY BIOPSY  08/05/2016    Performed by Tommie Sams, MD at [facility] ENDO    PLANTAR FASCIA SURGERY         Social History:  Social History     Tobacco Use    Smoking status: Former     Current packs/day: 0.00     Average packs/day: 0.5 packs/day for 20.0 years (10.0 ttl pk-yrs)     Types: Cigarettes     Start date: 12/14/1996     Quit date: 12/14/2016     Years since quitting: 5.6     Passive exposure: Never    Smokeless tobacco: Never   Vaping Use    Vaping status: Never Used   Substance Use Topics    Alcohol use: Yes     Comment: social    Drug use: No     Social History     Substance and Sexual Activity   Drug Use No             Family History:  Family History   Problem Relation Name Age of Onset    High Cholesterol Mother      Hypertension Mother      Other Mother          COPD    High Cholesterol Father      Heart Disease Father      Diabetes Father      Heart Attack Father      Hypertension Father      Other Father          arrhythmias    Cancer Maternal Grandmother          colon CA at age 78       Vitals:  ED Vitals      Date and Time T  BP P RR SPO2P SPO2 User   08/12/22 1231 -- -- 87 -- -- 94 % LS   08/12/22 1230 -- 124/96 95 -- -- 95 % LS   08/12/22 1032 -- -- 80 -- -- 95 % LS   08/12/22 1031 -- 120/81 78 -- -- 94 % LS   08/12/22 0900 -- 143/99 -- -- -- -- JC   08/12/22 0858 -- -- 101 -- -- 96 % JC   08/12/22 0856 37 ?C (98.6 ?F) -- -- 22 PER MINUTE -- -- JC            Physical Exam:  Physical Exam  Vitals and nursing note reviewed.   Constitutional:       Appearance: Normal appearance. She is normal weight.   HENT:      Head: Normocephalic and atraumatic.   Eyes:      Extraocular Movements: Extraocular movements intact.      Conjunctiva/sclera: Conjunctivae normal.   Cardiovascular:      Rate and Rhythm: Normal rate and regular rhythm.   Pulmonary:      Effort: Pulmonary effort is normal.      Breath sounds: Normal breath sounds.   Abdominal:      General: Bowel sounds are normal. There is no distension.      Palpations: Abdomen is soft.      Tenderness: There is no abdominal tenderness.   Musculoskeletal:         General: Normal range of motion.      Cervical back: Neck supple.   Neurological:      Mental Status: She is oriented to person, place, and time. Mental status is at baseline.   Psychiatric:         Mood and Affect: Mood normal.         Behavior: Behavior normal.         Laboratory Results:  Labs Reviewed   CBC AND DIFF - Abnormal       Result Value Ref Range Status    White Blood Cells 4.8  4.5 - 11.0 K/UL Final    RBC 3.76 (*) 4.0 - 5.0 M/UL Final    Hemoglobin 8.0 (*) 12.0 - 15.0 GM/DL Final    Hematocrit 16.1 (*) 36 - 45 % Final    MCV 68.0 (*) 80 - 100 FL Final    MCH 21.2 (*) 26 - 34 PG Final    MCHC 31.2 (*) 32.0 - 36.0 G/DL Final    RDW 09.6 (*) 11 - 15 % Final    Platelet Count 270  150 - 400 K/UL Final    MPV 7.7  7 - 11 FL Final    Neutrophils 77  41 - 77 % Final    Lymphocytes 12 (*) 24 - 44 % Final    Monocytes 8  4 - 12 % Final    Eosinophils 2  0 - 5 % Final    Basophils 1  0 - 2 % Final    Absolute Neutrophil Count 3.74  1.8 - 7.0 K/UL Final    Absolute Lymph Count 0.56 (*) 1.0 - 4.8 K/UL Final    Absolute Monocyte Count 0.38  0 - 0.80 K/UL Final    Absolute Eosinophil Count 0.10  0 - 0.45 K/UL Final    Absolute Basophil Count 0.03  0 - 0.20 K/UL Final    MDW (Monocyte Distribution Width) 15.4  <20.7 Final   COMPREHENSIVE METABOLIC PANEL - Abnormal  Sodium 139  137 - 147 MMOL/L Final    Potassium 4.5  3.5 - 5.1 MMOL/L Final    Chloride 103  98 - 110 MMOL/L Final    Glucose 124 (*) 70 - 100 MG/DL Final    Blood Urea Nitrogen 13  7 - 25 MG/DL Final    Creatinine 0.98  0.4 - 1.00 MG/DL Final    Calcium 9.4  8.5 - 10.6 MG/DL Final    Total Protein 6.9  6.0 - 8.0 G/DL Final    Total Bilirubin 0.3  0.2 - 1.3 MG/DL Final    Albumin 4.0  3.5 - 5.0 G/DL Final    Alk Phosphatase 112 (*) 25 - 110 U/L Final    AST (SGOT) 13  7 - 40 U/L Final    CO2 25  21 - 30 MMOL/L Final    ALT (SGPT) 10  7 - 56 U/L Final    Anion Gap 11  3 - 12 Final    eGFR >60  >60 mL/min Final   URINALYSIS DIPSTICK REFLEX TO CULTURE - Abnormal    Color,UA YELLOW   Final    Turbidity,UA CLEAR  CLEAR-CLEAR Final    Specific Gravity-Urine 1.017  1.005 - 1.030 Final    pH,UA 6.0  5.0 - 8.0 Final    Protein,UA 1+ (*) NEG-NEG Final    Glucose,UA NEG  NEG-NEG Final    Ketones,UA NEG  NEG-NEG Final    Bilirubin,UA NEG  NEG-NEG Final    Blood,UA 3+ (*) NEG-NEG Final    Urobilinogen,UA NORMAL  NORM-NORMAL Final    Nitrite,UA NEG  NEG-NEG Final    Leukocytes,UA TRACE (*) NEG-NEG Final    Urine Ascorbic Acid, UA NEG  NEG-NEG Final   URINALYSIS MICROSCOPIC REFLEX TO CULTURE - Abnormal    WBCs,UA 0-2  0 - 2 /HPF Final    RBCs,UA PACKED  0 - 3 /HPF Final    Comment,UA     Final    Value: Criteria for reflex to culture are WBC>10, Positive Nitrite, and/or >=+1   leukocytes. If quantity is not sufficient, an addendum will follow.      UA Reflex Specimen Type and Source     Final    Value: URINE  MIDSTREAM      MucousUA TRACE   Final    Bacteria,UA MODERATE (*) NEG-NEG Final    Squamous Epithelial Cells 0-2  0 - 5 Final   SED RATE - Abnormal    Sed Rate -ESR 32 (*) 0 - 30 MM/HR Final   CULTURE-BLOOD W/SENSITIVITY   CULTURE-BLOOD W/SENSITIVITY   LIPASE    Lipase 11  11 - 82 U/L Final   MAGNESIUM    Magnesium 1.8  1.6 - 2.6 mg/dL Final   C REACTIVE PROTEIN (CRP)    C-Reactive Protein 0.23  <1.0 MG/DL Final   POC LACTATE    LACTIC ACID POC 1.5  0.5 - 2.0 MMOL/L Final   UA GREY TOP TUBE   CLEAR TOP EXTRA URINE TUBE   POC LACTATE   POC LACTATE   TYPE & CROSSMATCH    Units Ordered 0   Final    Crossmatch Expires 08/15/2022,2359   Final    Record Check FOUND   Final    ABO/RH(D) O POS   Final    Antibody Screen NEG   Final    Electronic Crossmatch YES   Final          Radiology Interpretation:  CTA  ABD/PELVIS   Final Result      1.  No bowel obstruction, ascites, or focal inflammatory process.   2.  Persistent mild rectal wall thickening, greatest along the anterior aspect. This may be related to reported history of ulcerative colitis. Consider nonemergent endoscopy (if not previously performed).          Finalized by Judie Petit, M.D. on 08/12/2022 11:23 AM. Dictated by Judie Petit, M.D. on 08/12/2022 11:01 AM.             EKG:      Medical Decision Making:  Tanya French is a 57 y.o. female who presents with chief complaint as listed above. Based on the history and presentation, the list of differential diagnoses considered included, but was not limited to, ulcerative colitis, diverticulitis, pain seeking behavior, chronic pain    ED Course  Patient presents for abdominal pain, hematochezia. Vital signs notable for mild tachycardia on arrival. Administered IVF, fentanyl, Zofran.    Independently interpreted labs pertinent for No leukocytosis, anemia, normal lipase, creatinine at patient's baseline, and packed red blood cells and moderate bacteria in her urine .    By my independent interpretation, imaging showed redemonstrated bowel wall thickening of the anterior rectum.  No acute bleeds.  No other acute intra-abdominal inflammatory pathology.  This could be consistent with her ulcerative colitis.    On re-evaluation, patient reports ongoing pain.-Treated with Dilaudid.  Gave dose of Solu-Medrol.  Tachycardia has improved.  Reasonable to admit for further evaluation and management of a flare of her ulcerative colitis.  She is not currently established with a gastroenterologist.  Contacted medicine for admission, but notably, patient has [facility], and likely would require transfer to receive colonoscopy if accepted.  Contacted [facility] for possibility of transfer.           Complexity of Problems Addressed  Patient's active diagnoses as well as contributing pre-existing medical problems include:  Clinical Impression   Hematochezia   Left lower quadrant abdominal pain   Rectal pain     Evaluation performed for potential threat to life or bodily function during this visit given the initial differential diagnosis and clinical impression(s) as discussed previously in MDM/ED course.    Additional data reviewed:    History was obtained from an independent historian: Not in addition to what is mentioned above  Prior non-ED notes reviewed: Clinic note, Outside ED record, and Documentation from other hospital  Independent interpretation of diagnostic tests was performed by me: Not in addition to what is mentioned above  Patient presentation/management was discussed with the following qualified health care professionals and/or other relevant professionals: Admitting Physician and Consultant    Risk evaluation:    Diagnosis or treatment of patient condition impacted by social determinant of health: Problems related to substance use  Tests Considered but not performed due to clinical scoring (if not mentioned in ED course, aside from what is implied by clinical scores listed): None  Rationale regarding whether admission or escalation of care considered if not performed (if not mentioned in ED course, aside from what is implied by clinical scores listed): None    ED Scoring:                                Facility Administered Meds:  Medications   fentaNYL citrate PF (SUBLIMAZE) injection 50 mcg (50 mcg Intravenous Given 08/12/22 1030)   lactated ringers infusion (  0 mL Intravenous Infusion Stopped 08/12/22 1118)   ondansetron HCL (PF) (ZOFRAN (PF)) injection 4 mg (4 mg Intravenous Given 08/12/22 1031)   iohexoL (OMNIPAQUE-350) 350 mg/mL injection 100 mL (100 mL Intravenous Given 08/12/22 1044)   sodium chloride PF 0.9% injection 50 mL (50 mL Intravenous Given 08/12/22 1044)   HYDROmorphone injection (DILAUDID) 1 mg (1 mg Intravenous Given 08/12/22 1111)   methylPREDNISolone (SOLU-MEDROL PF) injection 125 mg (125 mg Intravenous Given 08/12/22 1222)   HYDROmorphone injection (DILAUDID) 1 mg (1 mg Intravenous Given 08/12/22 1223)       Clinical Impression:  Clinical Impression   Hematochezia   Left lower quadrant abdominal pain   Rectal pain       Disposition/Follow up  ED Disposition     None        No follow-up provider specified.    Medications:  New Prescriptions    No medications on file       Procedure Notes:  Procedures       Attestation / Supervision:  I, Henrine Screws, am scribing for and in the presence of Madelin Headings, MD    Henrine Screws  ---  A scribe was used for documentation of history and physical exam. I have personally evaluated this patient, and updated the documentation as necessary.    Madelin Headings MD   Department of Emergency Medicine   [facility] preferred - Callback [phone]    Note has been documented by Henrine Screws on 08/12/2022

## 2022-08-12 NOTE — ED Notes
Verbal order for pain meds obtained from provider. This RN entered room to update pt and the pt was not in the room. PIV in the sink and pt's belongings gone. MD notified.

## 2022-08-12 NOTE — Consults
General Medicine Initial Consult Note    Name: Tanya French        MRN: [id number]          DOB: 05/26/1965            Age: 56 y.o.  Admission Date: 08/12/2022       LOS: 0 days    Date of Service: 08/12/2022    Reason for Consult:      Consult type: Written and direct verbal contact to designated provider    Assessment    Tanya French is a 57 year old female w/pmhx anemia, anxiety, cataract, GERD, hepatic steatosis, kyphoplasty, HLD, periumbilical hernia, rectal ulcer, rectovaginal fistula, and sigmoid diverticulosis who presents to the emergency department for abdominal pain and blood in her stool.     She also presented on 6/3 to our ED for LLQ abdominal pain that radiated into her back, stabbing rectal pain, diarrhea, blood in stool, nausea, and a low-grade fever. She follows at [facility] and reports being on Mesalamine and Bentyl for possible UC. She does not follow with GI. 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     Hgb stable at baseline ~8. She likely does have IDA and would likely benefit from IV Iron infusion based on iron studies in 06/2021. CTA Abd/Pelv showed No bowel obstruction, ascites, or focal inflammatory process. Persistent mild rectal wall thickening, greatest along the anterior aspect. This may be related to reported history of ulcerative colitis. Consider nonemergent endoscopy (if not previously performed). In the ED, she received IV Dilaudid 1mg  x2 and Solumedrol 125mg .     Per chart review, patient has had frequent ED visits at multiple hospitals. On 5/4, she presented to ED in [place] with abdominal pain. Per notes, she requested IV narcotics and once she was told she would not be given IV narcotics, she then eloped from the ED. On 5/26, she presented after a mechanical fall to [facility]. She received IV Morphine and was dc'd from ED. She then presented 2 hours later to [facility] ED and reported another mechanical fall. She was not given further narcotics and she left the [facility] ED AMA. Patient has high ED utilization rate for pain related symptoms. She has presented to various facilities across the [facility] [place], [place], [place]) and requesting IV pain medications.     PDMP review shows: 34 narcotic scripts in 12 months. Past 6 months: 13 different prescribers, 4 filling pharmacies in past 6 months. In May 2024, she filled hydrocodone 3x (Hydrocodone #10 on 5/25, #9 on 5/13, #12 on 5/12).       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 She does not have clear UC diagnosis. Her path here showed solitary rectal ulcer syndrome associated w/chronic constipation. She does likely have IDA, previously low in April 2023. Ordered spec in lab iron panel study.     Discussed that since she is stable and would not be scoped by GI team until next week, that she would likely have to be transferred to [place] hospital for insurance being out of network. Discussed with patient her options of attempting transfer from ED to another facility in [place] vs being admitted and then attempting to transfer (w/risk of higher out of pocket  costs). She did not want to be transferred to [facility] due to being poorly treated per patient (not given narcotics as detailed above). She was okay to transfer to [facility].     She has drug seeking behaviors as extensively described above. Avoid narcotics.     Discussed case with ED and GI.   ED team attempting transfer to [facility] per patient preference.     Thank you for the consult.     Total Time Today was >55 minutes in the following activities: Preparing to see the patient, Obtaining and/or reviewing separately obtained history, Performing a medically appropriate examination and/or evaluation, Counseling and educating the patient/family/caregiver, Ordering medications, tests, or procedures, Referring and communication with other health care professionals (when not separately reported), Documenting clinical information in the electronic or other health record, Independently interpreting results (not separately reported) and communicating results to the patient/family/caregiver, and Care coordination (not separately reported)  _______________________________________________________    Chief Complaint:    History of Present Illness:     Tanya French is a 57 year old female w/pmhx anemia, anxiety, cataract, GERD, hepatic steatosis, kyphoplasty, HLD, periumbilical hernia, rectal ulcer, rectovaginal fistula, and sigmoid diverticulosis who presents to the emergency department for abdominal pain and blood in her stool.     She also presented on 6/3 to our ED for LLQ abdominal pain that radiated into her back, stabbing rectal pain, diarrhea, blood in stool, nausea, and a low-grade fever. She follows at [facility] and reports being on Mesalamine and Bentyl for possible UC. She does not follow with GI. 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 Today, she is reporting abdominal pain and blood in stools at times. Pain is diffuse and improved with pain meds.       Past Medical History:   Diagnosis Date    Anemia     Anxiety     Cataract     GERD (gastroesophageal reflux disease)     Hepatic steatosis     History of kyphoplasty     Hyperlipidemia     Periumbilical hernia     Rectal ulcer     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    s/p flap in June 2014    Sigmoid diverticulosis      Surgical History:   Procedure Laterality Date    ACL RECONSTRUCTION  2008    HYSTERECTOMY  2009    R oophorectomy    APPENDECTOMY  2009    CHOLECYSTECTOMY  2009    OOPHORECTOMY  2010    L oophorectomy    CARPAL TUNNEL RELEASE  2012 SIGMOIDOSCOPY DIAGNOSTIC N/A 03/20/2015    Performed by Tim Lair, MD at [facility] ENDO    SIGMOIDOSCOPY BIOPSY  03/20/2015    Performed by Tim Lair, MD at [facility] ENDO    COLONOSCOPY N/A 08/05/2016    Performed by Tommie Sams, MD at [facility] ENDO    COLONOSCOPY BIOPSY  08/05/2016    Performed by Tommie Sams, MD at [facility] ENDO    PLANTAR FASCIA SURGERY       Social History     Tobacco Use    Smoking status: Former     Current packs/day: 0.00     Average packs/day: 0.5 packs/day for 20.0 years (10.0 ttl pk-yrs)     Types: Cigarettes     Start date: 12/14/1996     Quit date: 12/14/2016     Years  since quitting: 5.6     Passive exposure: Never    Smokeless tobacco: Never   Vaping Use    Vaping status: Never Used   Substance and Sexual Activity    Alcohol use: Yes     Comment: social    Drug use: No           Family history reviewed; non-contributory    Allergies:  Toradol [ketorolac], Keflex [cephalexin], Compazine [prochlorperazine], Droperidol, and Haloperidol lactate    Scheduled Meds: Continuous Infusions:  PRN and Respiratory Meds:fentaNYL citrate PF Q1H PRN    Review of Systems:  A ROS was performed and noted in the HPI.  All other systems negative.    Vital Signs:  Last Filed in 24 hours Vital Signs:  24 hour Range    BP: 124/96 (06/07 1230)  Temp: 37 ?C (98.6 ?F) (06/07 6440)  Pulse: 87 (06/07 1231)  Respirations: 22 PER MINUTE (06/07 0856)  SpO2: 94 % (06/07 1231)  Height: 162.6 cm (5' 4) (06/07 0856) BP: (120-143)/(81-99)   Temp:  [37 ?C (98.6 ?F)]   Pulse:  [78-101]   Respirations:  [22 PER MINUTE]   SpO2:  [94 %-96 %]      Physical Exam:    General: Alert, cooperative, no distress  Head:  Normocephalic  Eyes:  No scleral icterus  Neck:  Supple, symmetrical  Lungs: CTA  Heart:  RRR  Abdomen:  Soft, nontender, nondistended   Skin:  No rashes  Neurologic:  Alert      Lab/Radiology/Other Diagnostic Tests:  Pertinent labs reviewed  Pertinent radiology reviewed.    Neta Ehlers, MD

## 2022-08-12 NOTE — ED Notes
Pt walked out to nursing station asking what is the plan. This RN informed pt that the provider is in the progress of getting her transferred to another facility and that there have been no new orders for her at this time. This RN has notified the MD of pt requesting pain meds. Pt states well what am is supposed to do just be here in pain. MD notified.

## 2022-08-12 NOTE — ED Notes
57 yo female presents to ED with CC of Abdominal Pain and Melena. Pt reports on Sunday she started having LLQ stabbing abdominal pain and stabbing rectal pain and bright red blood in her stool. Pt endorses that she was seen in this department on Monday for same complaint and presented today for increased pain and continued blood in stool. Pt endorses 9/10 stabbing pain to LLQ and rectum. Pt given 50 mcg fentanyl intranasal in route and 4mg  PO zofran in route by EMS. Pt denies any other complaints at this time. A/Ox4, airway patent, breathing even and non labored. Resting on cart, locked lowest position, call light within reach, on monitors. Awaiting physician evaluation.    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

## 2022-08-12 NOTE — ED Notes
Patient's Discharge Disposition: Left Before Treatment Complete Without Notification   Elke Christinia Lambeth has been evaluated by a Qualified Medical Provider (physician or advanced practice provider).    Lorice Lafave Moyers not in room at 1420. A reasonable attempt was made to locate the patient.     A second attempt was made at 1425 with no response. A reasonable attempt was made to locate the patient.     Patient did not notify staff of the intent to leave and staff did not have the opportunity to discuss refusal form.     Provider name: Dr. Lurline Idol was notified of left before treatment complete status at 1423.

## 2022-08-28 ENCOUNTER — Emergency Department: Admit: 2022-08-28 | Discharge: 2022-08-28 | Payer: Medicaid Other

## 2022-08-28 ENCOUNTER — Encounter: Admit: 2022-08-28 | Discharge: 2022-08-28 | Payer: Medicaid Other

## 2022-08-28 DIAGNOSIS — W108XXA Fall (on) (from) other stairs and steps, initial encounter: Secondary | ICD-10-CM

## 2022-08-28 LAB — COMPREHENSIVE METABOLIC PANEL
ALBUMIN: 4 g/dL — ABNORMAL HIGH (ref 3.5–5.0)
ALK PHOSPHATASE: 114 U/L — ABNORMAL HIGH (ref 25–110)
ALT: 8 U/L (ref 7–56)
ANION GAP: 12 K/UL (ref 3–12)
AST: 22 U/L (ref 7–40)
BLD UREA NITROGEN: 11 mg/dL — ABNORMAL LOW (ref 7–25)
CALCIUM: 9.2 mg/dL — ABNORMAL HIGH (ref 8.5–10.6)
CHLORIDE: 105 MMOL/L — ABNORMAL LOW (ref 98–110)
CO2: 23 MMOL/L (ref 21–30)
CREATININE: 0.7 mg/dL — ABNORMAL LOW (ref 0.4–1.00)
EGFR: >60 mL/min — ABNORMAL LOW (ref >60)
GLUCOSE,PANEL: 141 mg/dL — ABNORMAL HIGH (ref 70–100)
SODIUM: 140 MMOL/L — ABNORMAL LOW (ref 137–147)
TOTAL BILIRUBIN: 0.2 mg/dL (ref 0.2–1.3)
TOTAL PROTEIN: 7 g/dL (ref 6.0–8.0)

## 2022-08-28 LAB — CBC AND DIFF
ABSOLUTE BASO COUNT: 0 K/UL (ref 0–0.20)
ABSOLUTE EOS COUNT: 0 K/UL (ref 0–0.45)
ABSOLUTE MONO COUNT: 0.3 K/UL (ref 0–0.80)
MDW (MONOCYTE DISTRIBUTION WIDTH): 16 (ref <20.7)
WBC COUNT: 5.2 K/UL (ref 4.5–11.0)

## 2022-08-28 LAB — POC CREATININE, RAD: CREATININE, POC: 0.7 mg/dL (ref 0.4–1.00)

## 2022-08-28 LAB — LIPASE: LIPASE: 11 U/L — ABNORMAL LOW (ref 11–82)

## 2022-08-28 MED ORDER — ONDANSETRON HCL (PF) 4 MG/2 ML IJ SOLN
4 mg | Freq: Once | INTRAVENOUS | 0 refills | Status: CP
Start: 2022-08-28 — End: ?
  Administered 2022-08-28: 16:00:00 4 mg via INTRAVENOUS

## 2022-08-28 MED ORDER — IOHEXOL 350 MG IODINE/ML IV SOLN
100 mL | Freq: Once | INTRAVENOUS | 0 refills | Status: CP
Start: 2022-08-28 — End: ?
  Administered 2022-08-28: 16:00:00 100 mL via INTRAVENOUS

## 2022-08-28 MED ORDER — SODIUM CHLORIDE 0.9 % IJ SOLN
50 mL | Freq: Once | INTRAVENOUS | 0 refills | Status: CP
Start: 2022-08-28 — End: ?
  Administered 2022-08-28: 16:00:00 50 mL via INTRAVENOUS

## 2022-08-28 MED ORDER — ACETAMINOPHEN 325 MG PO TAB
650 mg | Freq: Once | ORAL | 0 refills | Status: CP
Start: 2022-08-28 — End: ?
  Administered 2022-08-28: 16:00:00 650 mg via ORAL

## 2022-08-28 NOTE — ED Notes
Pt presents to the ED via rapid response. Pt fell from the 2nd landing in the stairwell coming across the parking garage walkway to the hospital. Pt fell approximately 10 stairs. Complains of right sided rib, abdomen, hip, pelvis, thigh, lower back and coccyx pain. 10/10 in nature and constant. Pt C-Collar changed to [place] collar while maintaining C-Spine precautions. Pt placed on bedside monitor. Bed in low and locked position. Call light in reach.     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

## 2022-08-28 NOTE — ED Notes
Pt DC instructions provided. Pt verbalizes understanding of instructions and when to follow up with PCP vs return to the Emergency Department with worsening symptoms. PIV DC'd and catheter tip is intact. Pt ambulated out of the department without any difficulties. Pt home per POV.

## 2022-09-21 ENCOUNTER — Encounter: Admit: 2022-09-21 | Discharge: 2022-09-21 | Payer: Medicaid Other

## 2022-09-30 IMAGING — CR AAS
4 series · 4 of 4 positions shown · non-contrast
Comparison: none

[abdomen pa chest]
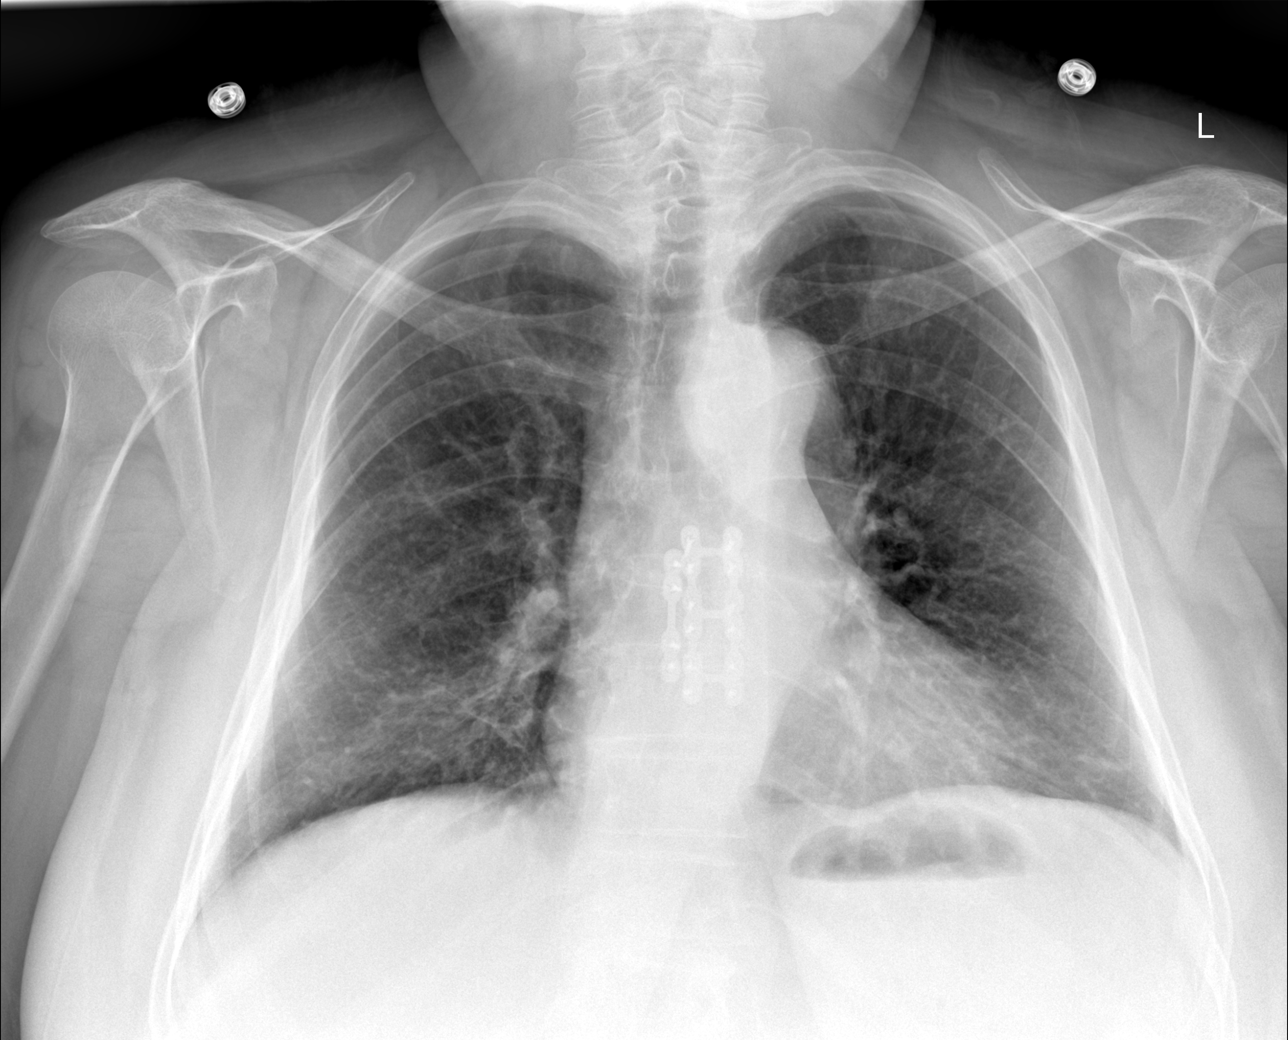

[abdomen ap upright]
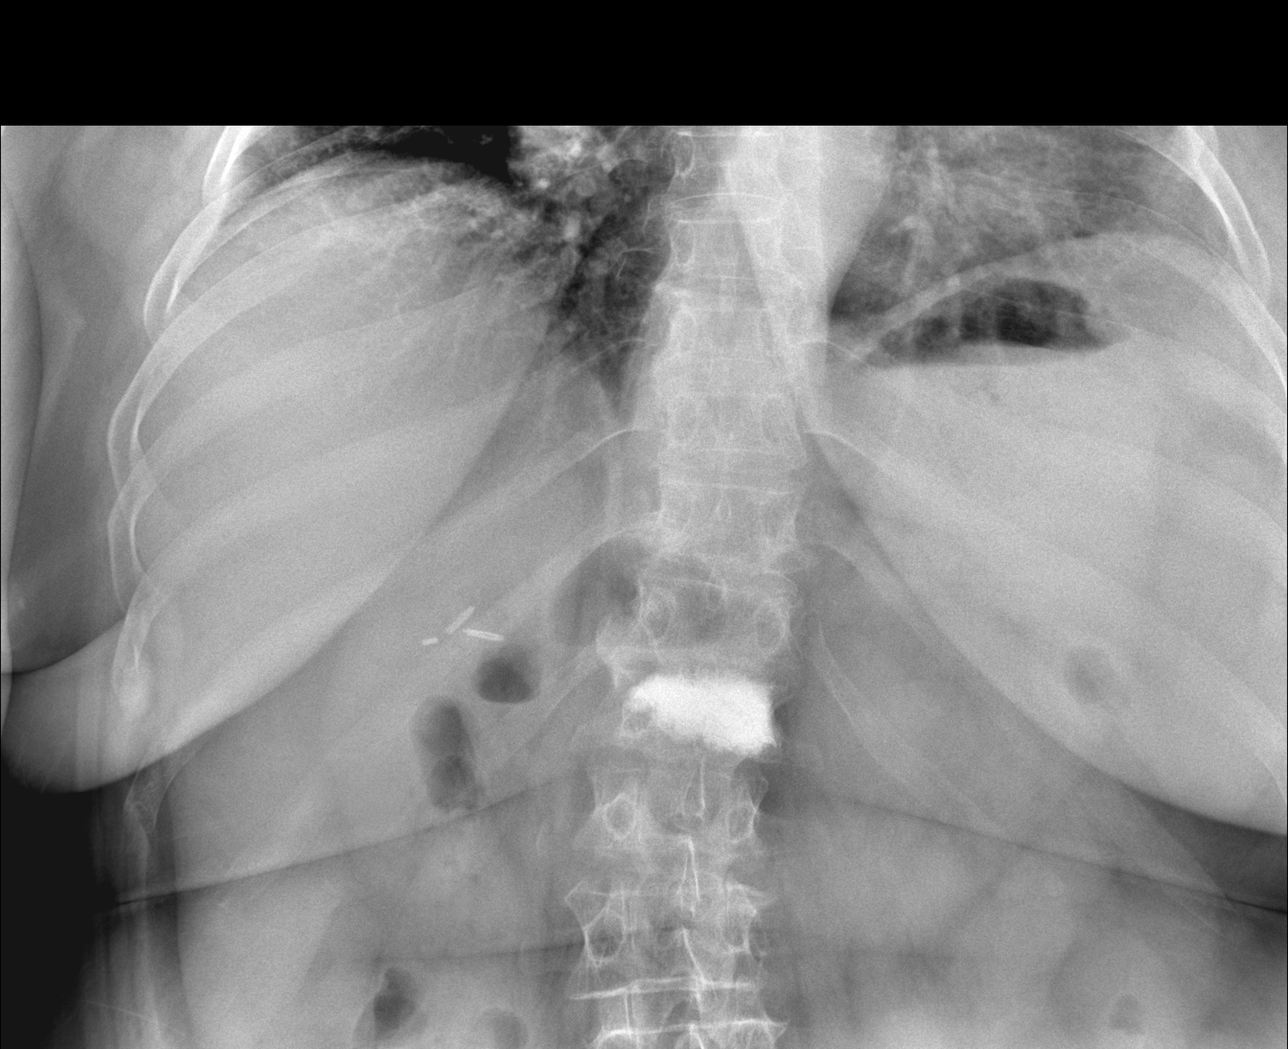

[abdomen kub supine (1 of 2)]
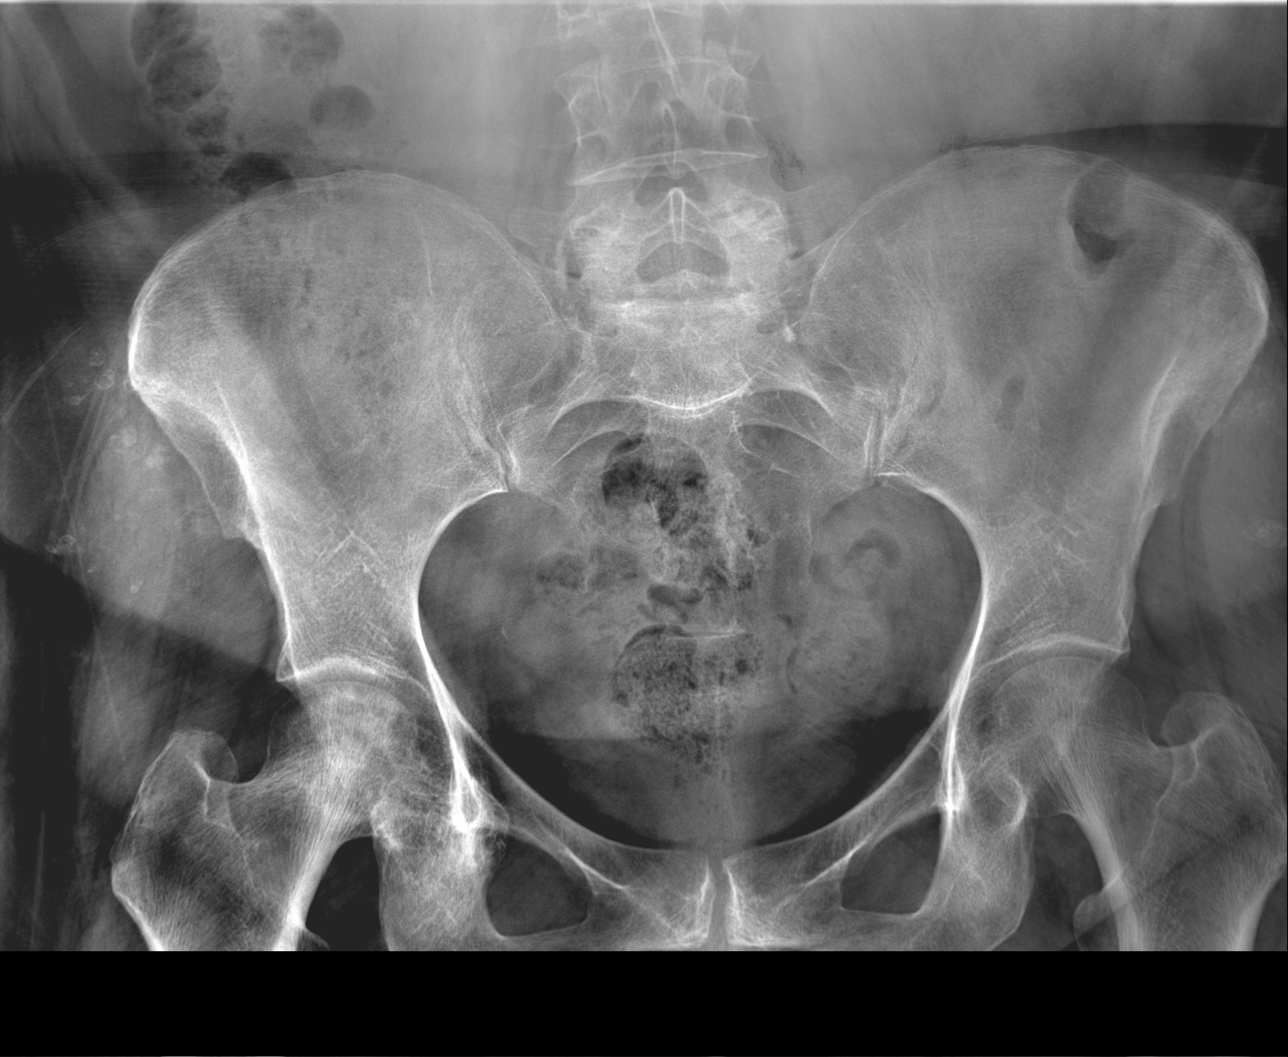

[abdomen kub supine (2 of 2)]
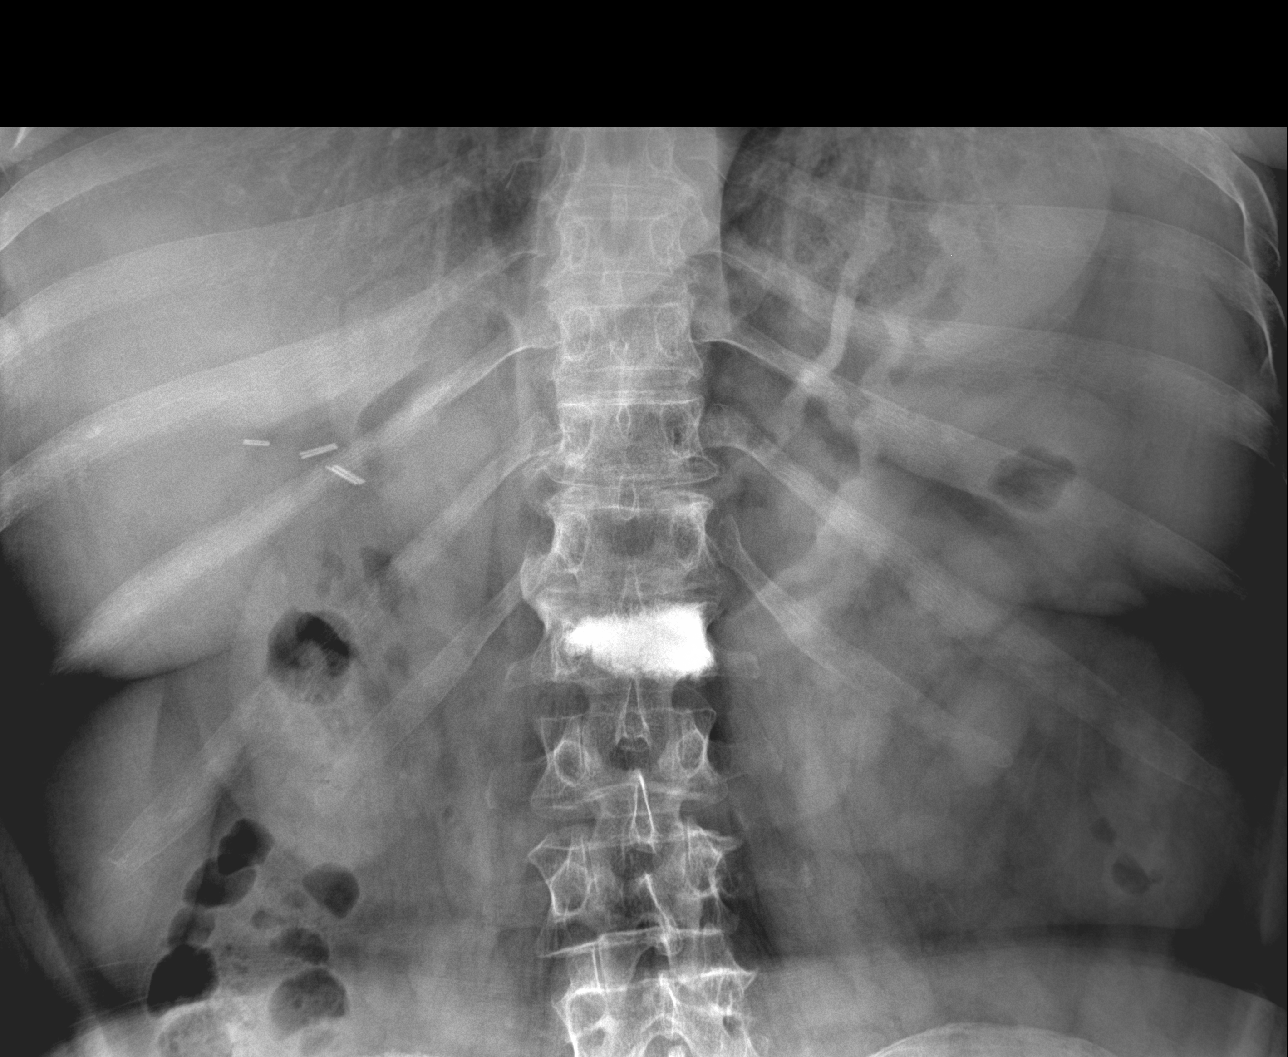

[4 of 4 positions shown; findings below may reference images not displayed]

12/06/20

DIAGNOSTIC STUDIES

EXAM

XR acute abdomen series

INDICATION

diffuse and chronic abdominal pain
pt c/o n/v/d and lower abd pain x2 days. hx appy, chole, hysterectomy and hernia repair. sternum
sx. prev 11/14/20 - ak

TECHNIQUE

AP view of the chest and three views of the abdomen was performed

COMPARISONS

FINDINGS

The heart is prominent. There is no acute airspace consolidation or effusion. There is a
nonobstructive bowel gas pattern. There are postsurgical changes in the right upper quadrant
consistent with prior cholecystectomy. There are prominent gastric folds which could represent
gastritis.

IMPRESSION

Prominent heart.

Nonobstructed bowel gas pattern.

There prominent gastric folds which could represent gastritis.

Tech Notes:

pt c/o n/v/d and lower abd pain x2 days. hx appy, chole, hysterectomy and hernia repair. sternum sx.
prev 11/14/20 - ak

## 2022-10-07 ENCOUNTER — Encounter: Admit: 2022-10-07 | Discharge: 2022-10-07 | Payer: Medicaid Other

## 2022-11-03 ENCOUNTER — Emergency Department: Admit: 2022-11-03 | Discharge: 2022-11-03 | Payer: Medicaid Other

## 2022-11-03 ENCOUNTER — Encounter: Admit: 2022-11-03 | Discharge: 2022-11-03 | Payer: Medicaid Other

## 2022-11-03 DIAGNOSIS — K5730 Diverticulosis of large intestine without perforation or abscess without bleeding: Secondary | ICD-10-CM

## 2022-11-03 DIAGNOSIS — N823 Fistula of vagina to large intestine: Secondary | ICD-10-CM

## 2022-11-03 DIAGNOSIS — K219 Gastro-esophageal reflux disease without esophagitis: Secondary | ICD-10-CM

## 2022-11-03 DIAGNOSIS — F419 Anxiety disorder, unspecified: Secondary | ICD-10-CM

## 2022-11-03 DIAGNOSIS — K626 Ulcer of anus and rectum: Secondary | ICD-10-CM

## 2022-11-03 DIAGNOSIS — K625 Hemorrhage of anus and rectum: Secondary | ICD-10-CM

## 2022-11-03 DIAGNOSIS — R1084 Generalized abdominal pain: Secondary | ICD-10-CM

## 2022-11-03 DIAGNOSIS — D649 Anemia, unspecified: Secondary | ICD-10-CM

## 2022-11-03 DIAGNOSIS — E785 Hyperlipidemia, unspecified: Secondary | ICD-10-CM

## 2022-11-03 DIAGNOSIS — K429 Umbilical hernia without obstruction or gangrene: Secondary | ICD-10-CM

## 2022-11-03 DIAGNOSIS — H269 Unspecified cataract: Secondary | ICD-10-CM

## 2022-11-03 DIAGNOSIS — D508 Other iron deficiency anemias: Secondary | ICD-10-CM

## 2022-11-03 DIAGNOSIS — Z9889 Other specified postprocedural states: Secondary | ICD-10-CM

## 2022-11-03 DIAGNOSIS — K760 Fatty (change of) liver, not elsewhere classified: Secondary | ICD-10-CM

## 2022-11-03 LAB — CBC AND DIFF: WBC COUNT: 6.3 10*3/uL (ref 4.5–11.0)

## 2022-11-03 LAB — HIGH SENSITIVITY TROPONIN I 0 HOUR: HIGH SENSITIVITY TROPONIN I 0 HOUR: 3 ng/L — ABNORMAL HIGH (ref <12)

## 2022-11-03 LAB — COMPREHENSIVE METABOLIC PANEL
BLD UREA NITROGEN: 13 mg/dL — ABNORMAL LOW (ref 7–25)
CALCIUM: 9.1 mg/dL — ABNORMAL HIGH (ref 8.5–10.6)
CREATININE: 0.7 mg/dL — ABNORMAL LOW (ref 0.4–1.00)
SODIUM: 135 MMOL/L — ABNORMAL LOW (ref 137–147)
TOTAL PROTEIN: 7.1 g/dL (ref 6.0–8.0)

## 2022-11-03 LAB — URINALYSIS MICROSCOPIC REFLEX TO CULTURE

## 2022-11-03 LAB — URINALYSIS DIPSTICK REFLEX TO CULTURE
NITRITE: NEGATIVE 10*3/uL (ref 0–0.20)
URINE ASCORBIC ACID, UA: NEGATIVE
URINE BILE: NEGATIVE mL/min — ABNORMAL LOW (ref 1.0–4.8)
URINE BLOOD: NEGATIVE 10*3/uL (ref 0–0.80)

## 2022-11-03 LAB — MAGNESIUM: MAGNESIUM: 2.2 mg/dL — ABNORMAL LOW (ref 1.6–2.6)

## 2022-11-03 LAB — LIPASE: LIPASE: 10 U/L — ABNORMAL LOW (ref 11–82)

## 2022-11-03 LAB — POC GLUCOSE: POC GLUCOSE: 211 mg/dL — ABNORMAL HIGH (ref 70–100)

## 2022-11-03 MED ORDER — LACTATED RINGERS IV SOLP
1000 mL | INTRAVENOUS | 0 refills | Status: CP
Start: 2022-11-03 — End: ?
  Administered 2022-11-03: 15:00:00 1000 mL via INTRAVENOUS

## 2022-11-03 MED ORDER — DICYCLOMINE 10 MG PO CAP
10 mg | Freq: Once | ORAL | 0 refills | Status: CP
Start: 2022-11-03 — End: ?
  Administered 2022-11-03: 15:00:00 10 mg via ORAL

## 2022-11-03 MED ORDER — ACETAMINOPHEN 500 MG PO TAB
1000 mg | ORAL | 0 refills | Status: DC | PRN
Start: 2022-11-03 — End: 2022-11-03
  Administered 2022-11-03: 16:00:00 1000 mg via ORAL

## 2022-11-03 MED ORDER — METHOCARBAMOL 100 MG/ML IJ SOLN
750 mg | Freq: Once | INTRAVENOUS | 0 refills | Status: CP
Start: 2022-11-03 — End: ?
  Administered 2022-11-03: 16:00:00 750 mg via INTRAVENOUS

## 2022-11-03 MED ORDER — PANTOPRAZOLE 40 MG IV SOLR
80 mg | Freq: Once | INTRAVENOUS | 0 refills | Status: CP
Start: 2022-11-03 — End: ?
  Administered 2022-11-03: 15:00:00 80 mg via INTRAVENOUS

## 2022-11-03 MED ORDER — ONDANSETRON HCL (PF) 4 MG/2 ML IJ SOLN
4 mg | Freq: Once | INTRAVENOUS | 0 refills | Status: CP
Start: 2022-11-03 — End: ?
  Administered 2022-11-03: 15:00:00 4 mg via INTRAVENOUS

## 2022-11-03 MED ORDER — FENTANYL CITRATE (PF) 50 MCG/ML IJ SOLN
50 ug | Freq: Once | INTRAVENOUS | 0 refills | Status: CP
Start: 2022-11-03 — End: ?
  Administered 2022-11-03: 15:00:00 50 ug via INTRAVENOUS

## 2022-11-03 NOTE — ED Notes
This RN provides discharge teaching and instructions. Pt AAOx4, verbalizes understanding and denies further questions. Pt ambulates off unit with an even, steady gait in no acute distress. All belongings and paperwork in pts possession upon departure.

## 2022-11-03 NOTE — ED Provider Notes
Chief Complaint:  Chief Complaint   Patient presents with    Abdominal pain     left sided abd pain spleen area radiating to the back    Hemoptysis    Melena       History of Present Illness:  Tanya French is a 57 y.o. female with PMHx significant for ulcerative colitis, GERD, and chronic anemia presenting for evaluation of abdominal pain, hemoptysis, and blood in stool. The patient states her abdominal pain began last night at 9pm, is located in the epigastric region and radiates around her left side to the middle of her back, is sharp in nature, and rates it an 8/10. The pain has been constant since it began. She has also had 4 episodes of hemoptysis since last night. She says her vomit has been small volume and looks like blood mixed with mucus. She also endorses two episodes of bright red blood mixed in her stools since last night. Her last BM was early this morning. She denies chest pain, SOB, diarrhea, fever or chills, lightheadedness, and no lower extremity swelling.      History provided by:  Patient  Language interpreter used: No    Abdominal pain  Associated symptoms: hematochezia, nausea and vomiting    Associated symptoms: no chest pain, no chills, no constipation, no cough, no diarrhea, no dysuria, no fever, no hematuria, no shortness of breath and no sore throat    Melena  Associated symptoms: abdominal pain and vomiting    Associated symptoms: no dizziness, no fever and no light-headedness        Review of Systems:  Review of Systems   Constitutional:  Negative for chills, diaphoresis and fever.   HENT:  Negative for congestion, rhinorrhea, sneezing and sore throat.    Respiratory:  Negative for cough, chest tightness, shortness of breath, wheezing and stridor.    Cardiovascular:  Negative for chest pain and leg swelling.   Gastrointestinal:  Positive for abdominal pain, blood in stool, hematochezia, nausea and vomiting. Negative for abdominal distention, constipation and diarrhea. Genitourinary:  Negative for dysuria, hematuria and pelvic pain.   Musculoskeletal:  Positive for back pain.   Skin:  Negative for rash and wound.   Neurological:  Negative for dizziness, weakness, light-headedness and headaches.       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    Sigmoid diverticulosis        Past Surgical History:  Surgical History:   Procedure Laterality Date    ACL RECONSTRUCTION  2008    HYSTERECTOMY  2009    R oophorectomy    APPENDECTOMY  2009    CHOLECYSTECTOMY  2009    OOPHORECTOMY  2010    L oophorectomy    CARPAL TUNNEL RELEASE  2012    SIGMOIDOSCOPY DIAGNOSTIC N/A 03/20/2015    Performed by Tim Lair, MD at [facility] ENDO    SIGMOIDOSCOPY BIOPSY  03/20/2015    Performed by Tim Lair, MD at [facility] ENDO  COLONOSCOPY N/A 08/05/2016    Performed by Tommie Sams, MD at [facility] END[facility]    COLONOSCOPY BIOPSY  08/05/2016    Performed by Tommie Sams, MD at [facility] END[facility]    PLANTAR FASCIA SURGERY         Pertinent medical/surgical history reviewed    Social History:  Social History     Tobacco Use    Smoking status: Former     Current packs/day: 0.00     Average packs/day: 0.5 packs/day for 20.0 years (10.0 ttl pk-yrs)     Types: Cigarettes     Start date: 12/14/1996     Quit date: 12/14/2016     Years since quitting: 5.8     Passive exposure: Never    Smokeless tobacco: Never   Vaping Use    Vaping status: Never Used   Substance Use Topics    Alcohol use: Yes     Comment: social    Drug use: No     Social History     Substance and Sexual Activity   Drug Use No             Family History:  Family History   Problem Relation Name Age of Onset    High Cholesterol Mother      Hypertension Mother      Other Mother          COPD    High Cholesterol Father      Heart Disease Father      Diabetes Father      Heart Attack Father      Hypertension Father      Other Father          arrhythmias    Cancer Maternal Grandmother          colon CA at age 50       Vitals:  ED Vitals      Date and Time T BP P RR SPO2P SPO2 User   11/03/22 1204 -- -- 63 18 PER MINUTE -- 97 % MM   11/03/22 1200 -- 131/82 -- 18 PER MINUTE -- 98 % MM   11/03/22 1130 -- 128/88 36 19 PER MINUTE -- 99 % MM   11/03/22 1115 -- 126/79 35 20 PER MINUTE -- 97 % MM   11/03/22 1002 -- 135/79 47 10 PER MINUTE -- 98 % AG   11/03/22 0934 -- -- 65 19 PER MINUTE -- 99 % MM   11/03/22 0900 36.6 ?C (97.9 ?F) -- -- -- -- -- [facility]   11/03/22 0852 -- -- 37 20 PER MINUTE -- 96 % [facility]   11/03/22 0846 -- -- 38 -- -- 97 % [facility]   11/03/22 0843 -- 106/66 52 -- -- -- [facility]            Physical Exam:  Physical Exam  Exam conducted with a chaperone present.   Constitutional:       Comments: Patient appears uncomfortable in bed.   HENT:      Head: Normocephalic and atraumatic.      Nose: No congestion or rhinorrhea.      Mouth/Throat:      Mouth: Mucous membranes are dry.      Pharynx: No oropharyngeal exudate or posterior oropharyngeal erythema.   Eyes:      Conjunctiva/sclera: Conjunctivae normal.      Pupils: Pupils are equal, round, and reactive to light.   Cardiovascular:      Rate and Rhythm: Regular rhythm.  Bradycardia present.      Pulses: Normal pulses.      Heart sounds: Normal heart sounds.   Pulmonary:      Effort: Pulmonary effort is normal.      Breath sounds: Normal breath sounds. No wheezing, rhonchi or rales.   Abdominal:      Tenderness: There is abdominal tenderness. There is guarding. There is no rebound.      Comments: Epigastric guarding and tenderness to palpation.  LUQ tenderness to palpation.   Genitourinary:     Rectum: Guaiac result positive.   Musculoskeletal:      Cervical back: Normal range of motion.      Right lower leg: No edema.      Left lower leg: No edema.   Skin:     Findings: No lesion or rash.   Neurological:      General: No focal deficit present.      Mental Status: She is alert and oriented to person, place, and time.   Psychiatric:         Mood and Affect: Mood normal.         Behavior: Behavior normal.         Laboratory Results:  Labs Reviewed   POC GLUCOSE - Abnormal       Result Value Ref Range Status    Glucose, POC 211 (*) 70 - 100 MG/DL Final   CBC AND DIFF - Abnormal    White Blood Cells 6.3  4.5 - 11.0 K/UL Final    RBC 3.69 (*) 4.0 - 5.0 M/UL Final    Hemoglobin 7.8 (*) 12.0 - 15.0 GM/DL Final    Hematocrit 56.2 (*) 36 - 45 % Final    MCV 66.5 (*) 80 - 100 FL Final    MCH 21.1 (*) 26 - 34 PG Final    MCHC 31.7 (*) 32.0 - 36.0 G/DL Final    RDW 13.0 (*) 11 - 15 % Final    Platelet Count 282  150 - 400 K/UL Final    MPV 7.9  7 - 11 FL Final    Neutrophils 87 (*) 41 - 77 % Final    Lymphocytes 8 (*) 24 - 44 % Final    Monocytes 4  4 - 12 % Final    Eosinophils 1  0 - 5 % Final    Basophils 0  0 - 2 % Final    Absolute Neutrophil Count 5.52  1.8 - 7.0 K/UL Final    Absolute Lymph Count 0.49 (*) 1.0 - 4.8 K/UL Final    Absolute Monocyte Count 0.25  0 - 0.80 K/UL Final    Absolute Eosinophil Count 0.06  0 - 0.45 K/UL Final    Absolute Basophil Count 0.02  0 - 0.20 K/UL Final    MDW (Monocyte Distribution Width) 17.1  <20.7 Final   COMPREHENSIVE METABOLIC PANEL - Abnormal    Sodium 135 (*) 137 - 147 MMOL/L Final    Potassium 4.7  3.5 - 5.1 MMOL/L Final    Chloride 102  98 - 110 MMOL/L Final    Glucose 193 (*) 70 - 100 MG/DL Final    Blood Urea Nitrogen 13  7 - 25 MG/DL Final    Creatinine 8.65  0.4 - 1.00 MG/DL Final    Calcium 9.1  8.5 - 10.6 MG/DL Final    Total Protein 7.1  6.0 - 8.0 G/DL Final    Total Bilirubin 0.3  0.2 - 1.3  MG/DL Final    Albumin 4.1  3.5 - 5.0 G/DL Final    Alk Phosphatase 127 (*) 25 - 110 U/L Final    AST (SGOT) 14  7 - 40 U/L Final    CO2 23  21 - 30 MMOL/L Final    ALT (SGPT) 10  7 - 56 U/L Final    Anion Gap 10  3 - 12 Final    eGFR >60  >60 mL/min Final   LIPASE - Abnormal    Lipase 10 (*) 11 - 82 U/L Final   NT-PRO-BNP - Abnormal    NT-Pro-BNP 152.0 (*) <125 pg/mL Final   URINALYSIS DIPSTICK REFLEX TO CULTURE - Abnormal    Color,UA STRAW   Final    Turbidity,UA CLEAR  CLEAR-CLEAR Final    Specific Gravity-Urine 1.004 (*) 1.005 - 1.030 Final    pH,UA 7.0  5.0 - 8.0 Final    Protein,UA NEG  NEG-NEG Final    Glucose,UA NEG  NEG-NEG Final    Ketones,UA NEG  NEG-NEG Final    Bilirubin,UA NEG  NEG-NEG Final    Blood,UA NEG  NEG-NEG Final    Urobilinogen,UA NORMAL  NORM-NORMAL Final    Nitrite,UA NEG  NEG-NEG Final    Leukocytes,UA TRACE (*) NEG-NEG Final    Urine Ascorbic Acid, UA NEG  NEG-NEG Final   MAGNESIUM    Magnesium 2.2  1.6 - 2.6 mg/dL Final   HIGH SENSITIVITY TROPONIN I 0 HOUR    hs Troponin I 0 Hour 3  <12 ng/L Final   URINALYSIS MICROSCOPIC REFLEX TO CULTURE    WBCs,UA 0-2  0 - 2 /HPF Final    RBCs,UA NONE  0 - 3 /HPF Final    Comment,UA     Final    Value: Criteria for reflex to culture are WBC>10, Positive Nitrite, and/or >=+1   leukocytes. If quantity is not sufficient, an addendum will follow.      UA Reflex Specimen Type and Source     Final    Value: URINE  MIDSTREAM      MucousUA TRACE   Final    Squamous Epithelial Cells 0-2  0 - 5 Final   UA GREY TOP TUBE   CLEAR TOP EXTRA URINE TUBE     POC Glucose (Download): (!) 211  Hemoccult  Hemoccult: (S) Positive  QC: Acceptable  Hem. Lot#: [id number]    Radiology Interpretation:  CHEST SINGLE VIEW   Final Result         No new consolidation.          Finalized by Arlana Hove, M.D. on 11/03/2022 9:41 AM. Dictated by Arlana Hove, M.D. on 11/03/2022 9:39 AM.             EKG:  ECG Results              [facility] ED MAIN ECG TRIAGE ONLY (Final result)        Collection Time Result Time VT RATE P-R Interval QRS DURATION Q-T Interval QTC Calc Bazett    11/03/22 08:49:07 11/03/22 08:58:32 36 192 90 504 389             Collection Time Result Time P Axis R Axis T Axis    11/03/22 08:49:07 11/03/22 08:58:32 47 24 9                     Final result  Impression:    Marked sinus bradycardia  Abnormal ECG  When compared with ECG of 28-Aug-2022 11:00,  Vent. rate has decreased BY  64 BPM  QT has shortened  Confirmed by Scheryl Darter ([id number]) on 11/03/2022 8:58:27 AM                                    Medical Decision Making:  Roanna Schambach is a 57 y.o. female who presents with chief complaint as listed above. Based on the history and presentation, the list of differential diagnoses considered included, but was not limited to, pancreatitis, gastric ulcer, ulcerative colitis flair.     Outside records reviewed as well as previous imaging here.  Patient's abdominal exam is without peritonitis and given that she has had pain in this area on multiple prior presentations we will hold on additional CT scan this time.  Patient is bradycardic on arrival so we will pursue a cardiac workup as well.  Chest x-ray without any acute findings.  Lab work overall unremarkable with a slight hyponatremia but no severe signs of hyponatremia.  CBC with a stable microcytic anemia but otherwise no concerning blood count abnormalities.  Patient is hyperglycemic but has no anion gap or other indicators of ongoing DKA.  Lipase in the reference limit-low concern for pancreatitis.  Initial troponin 3 and EKG is without signs of active myocardial ischemia-lower concern for ACS.  BNP slightly elevated patient has no lower extremity edema and is on room air-lower concern for acute heart failure exacerbation.  Urinalysis without signs of infection.  Patient was significantly bradycardic upon arrival but was maintained normally with normal blood pressures.  Outside chart review reveals she has been worked up for bradycardia in the past.  I suspect this is this patient's baseline.    ED Course  Patient's EKG taken on arrival shows sinus bradycardia at 36 bpm.  Went to bedside to evaluate patient who has normal blood pressures and is in otherwise no acute distress       Complexity of Problems Addressed  Patient's active diagnoses as well as contributing pre-existing medical problems include:  Clinical Impression   Abdominal pain, generalized   Rectal bleeding   Gastroesophageal reflux disease without esophagitis   Other iron deficiency anemia     Evaluation performed for potential threat to life or bodily function during this visit given the initial differential diagnosis and clinical impression(s) as discussed previously in MDM/ED course.    Additional data reviewed:    History was obtained from an independent historian: Not in addition to what is mentioned above  Prior non-ED notes reviewed: Recent discharge summary and/or H&P, Clinic note, and Outside ED record  Independent interpretation of diagnostic tests was performed by me: X-Ray: Independently interpreted by myself-no significant cardiopulmonary abnormalities  Patient presentation/management was discussed with the following qualified health care professionals and/or other relevant professionals: Not in addition to what is mentioned above    Risk evaluation:    Diagnosis or treatment of patient condition impacted by social determinant of health: Problems related to social support, potentially including family circumstances  Tests Considered but not performed due to clinical scoring (if not mentioned in ED course, aside from what is implied by clinical scores listed):   Rationale regarding whether admission or escalation of care considered if not performed (if not mentioned in ED course, aside from what is implied by clinical scores listed):  ED Scoring:                                Facility Administered Meds:  Medications   fentaNYL citrate PF (SUBLIMAZE) injection 50 mcg (50 mcg Intravenous Given 11/03/22 0951)   ondansetron HCL (PF) (ZOFRAN (PF)) injection 4 mg (4 mg Intravenous Given 11/03/22 0950)   dicyclomine (BENTYL) capsule 10 mg (10 mg Oral Given 11/03/22 0950)   lactated ringers infusion (0 mL Intravenous Infusion Stopped 11/03/22 1210)   pantoprazole (PROTONIX) injection 80 mg (80 mg Intravenous Given 11/03/22 0951)   methocarbamoL (ROBAXIN) injection 750 mg (750 mg Intravenous Given 11/03/22 1052)       Clinical Impression:  Clinical Impression   Abdominal pain, generalized   Rectal bleeding   Gastroesophageal reflux disease without esophagitis   Other iron deficiency anemia       Disposition/Follow up  ED Disposition     ED Disposition   Discharge           Dpt , Family Medicine  [address]  [id number] [place] [address]  [phone]    Call in 1 week  Establish care if you do not have a primary care provider    Internal Medicine: [facility]  [address], [address]  [place] [address]  [phone]  In 1 week  To establish care if you do not already have a primary care physician      Medications:  Discharge Medication List as of 11/03/2022 12:24 PM          Procedure Notes:  Procedures       Attestation / Supervision:      Kimberlee Nearing

## 2022-11-03 NOTE — ED Notes
Pt tearful about pain and reports pain has not improved. Pt educated on current medications given and process of pain management. Bode notified of pt pain.

## 2022-11-03 NOTE — ED Notes
Pt presents to ED27 w/ CC of LUQ abd pain, hemoptysis, and melena. Pt reports 9/10 sharp, stabbing pain in her LUQ that travels straight through her back. Pt states that this started yesterday and she went to [facility] w/o relief of symptoms. Pt also endorsing hemoptysis and blood streaks in her stools. Pt is not on blood thinners. Pt reports a hx of UC but states that this is more severe than previous episodes.     Pt connected to monitors upon arrival to ED room and found to have a HR in the 30s. EKG preformed. Pt denies hx of low heart rate or other cardiac issues. Pt denies SOA, lightheadedness, dizziness, or diaphoresis.     Dr. Erline Levine at bedside.     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

## 2022-11-03 NOTE — Unmapped
You are seen today in the ED for concerns of abdominal pain.  We checked lab work that was all reassuring.  Your hemoglobin was stable today from prior checks.  We looked at prior EKGs appears that your heart rate has been low in the past.  We gave you pain medications at this time we think it is appropriate for her to follow-up with your primary care doctor.  Please return to this ED with any fevers, passing out, chest pain, shortness of breath.

## 2022-11-03 NOTE — Consults
SW was contacted by RN stating pt needs a ride home.  Estimated cost is $30.46 for a 13.3 mile trip.    SW contacted [employer] [phone] via taxi butler to arrange ride to [address] [place], [place], [place] [address].  SW completed cab voucher and provided to Triage.      Trudie Reed, LSCSW, ACM, CCM  Social Work Case Manager  voalte or pager: [phone]

## 2022-12-19 ENCOUNTER — Encounter: Admit: 2022-12-19 | Discharge: 2022-12-19 | Payer: MEDICAID

## 2022-12-19 ENCOUNTER — Emergency Department: Admit: 2022-12-19 | Discharge: 2022-12-19 | Discharge status: Against Medical Advice | Payer: MEDICAID

## 2022-12-19 DIAGNOSIS — R1084 Generalized abdominal pain: Secondary | ICD-10-CM

## 2022-12-19 MED ORDER — ONDANSETRON 4 MG PO TBDI
4 mg | Freq: Once | ORAL | 0 refills | Status: DC
Start: 2022-12-19 — End: 2022-12-19

## 2022-12-19 MED ORDER — LIDOCAINE HCL 2 % MM SOLN
15 mL | Freq: Once | ORAL | 0 refills | Status: DC
Start: 2022-12-19 — End: 2022-12-19

## 2022-12-19 MED ORDER — FAMOTIDINE 20 MG PO TAB
40 mg | Freq: Once | ORAL | 0 refills | Status: DC
Start: 2022-12-19 — End: 2022-12-19

## 2022-12-19 MED ORDER — ALUM-MAG HYDROXIDE-SIMETH 200-200-20 MG/5 ML PO SUSP
30 mL | Freq: Once | ORAL | 0 refills | Status: DC
Start: 2022-12-19 — End: 2022-12-19

## 2022-12-19 NOTE — Unmapped
You were seen at the [facility] Emergency Department.    Your workup showed no imminent life threatening emergencies. Thus, we feel safe letting you go home at this time. Please follow-up with the provider indicated in your discharge paperwork. If you are following up with a doctor not in the [facility] network, please have your doctor's office contact medical records so your doctor can obtain full records from your visit today and go over them.    Please report to nearest emergency department for any sudden decline in overall health, new symptoms, or any other concern.

## 2022-12-19 NOTE — ED Notes
Patient's Discharge Disposition: Against Medical Advice [facility])   The risks of leaving and benefits of staying to receive Medical Screening Examination and any necessary stabilizing treatment discussed with Patient.  AMA form was NOT signed by the patient or their representative.

## 2022-12-19 NOTE — ED Provider Notes
Tanya French is a 57 y.o. female.    Chief Complaint:  Chief Complaint   Patient presents with    Abdominal pain     Abdominal pain x 1 day        History of Present Illness:  Patient is a 57 year old female with a past medical history of GERD, iron deficiency anemia depression/anxiety,  HDL who is presenting for abdominal pain, hematemesis and hematochezia. She reports all of these symptoms started yesterday morning. She said that [facility] did not find anything on her labwork yesterday. She reports having 6 hematemesis, 1 bloody stool an hour. She said she woke up with a fever of 101F this morning after which she took 4 extra strength tylenol. She denies NSAID use.     She has been seen several times at [facility] for similar pain and was recently admitted early September for abdominal pain and hematemesis.  She has an unconfirmed history of ulcerative colitis but says she is currently on Bentyl and mesalamine.  EGD was performed during this admission and did not show any active bleeding lesions.  EGD overall was very normal. The last colonoscopy in our system was in 2018 found rectal ulcer, biopsy suggested more of a solitary lesion and was negative for CMV and was not suggestive of IBD.  She has had 19 CT abdomens in the past year, her last one was 9/17 and did not show any acute process.    Review of Systems:  ROS negative except for what is mentioned in HPI.    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    Sigmoid diverticulosis        Past Surgical History:  Surgical History:   Procedure Laterality Date ACL RECONSTRUCTION  2008    HYSTERECTOMY  2009    R oophorectomy    APPENDECTOMY  2009    CHOLECYSTECTOMY  2009    OOPHORECTOMY  2010    L oophorectomy    CARPAL TUNNEL RELEASE  2012    SIGMOIDOSCOPY DIAGNOSTIC N/A 03/20/2015    Performed by Tim Lair, MD at [facility] ENDO    SIGMOIDOSCOPY BIOPSY  03/20/2015    Performed by Tim Lair, MD at [facility]    COLONOSCOPY N/A 08/05/2016    Performed by Tommie Sams, MD at [facility] ENDO    COLONOSCOPY BIOPSY  08/05/2016    Performed by Tommie Sams, MD at [facility] ENDO    PLANTAR FASCIA SURGERY         Pertinent medical/surgical history reviewed    Social History:  Social History     Tobacco Use    Smoking status: Former     Current packs/day: 0.00     Average packs/day: 0.5 packs/day for 20.0 years (10.0 ttl pk-yrs)     Types: Cigarettes     Start date: 12/14/1996     Quit date: 12/14/2016     Years since quitting: 6.0     Passive exposure: Never    Smokeless tobacco:  Never   Vaping Use    Vaping status: Never Used   Substance Use Topics    Alcohol use: Yes     Comment: social    Drug use: No     Social History     Substance and Sexual Activity   Drug Use No             Family History:  Family History   Problem Relation Name Age of Onset    High Cholesterol Mother      Hypertension Mother      Other Mother          COPD    High Cholesterol Father      Heart Disease Father      Diabetes Father      Heart Attack Father      Hypertension Father      Other Father          arrhythmias    Cancer Maternal Grandmother          colon CA at age 38       Vitals:  ED Vitals      Date and Time T BP P RR SPO2P SPO2 User   12/19/22 0951 36.8 ?C (98.3 ?F) 132/109 77 14 PER MINUTE -- 98 % AG            Physical Exam:  Physical Exam  Constitutional:       Appearance: Normal appearance.   Eyes:      Extraocular Movements: Extraocular movements intact.   Cardiovascular:      Rate and Rhythm: Normal rate and regular rhythm.   Pulmonary:      Effort: Pulmonary effort is normal.      Breath sounds: Normal breath sounds.   Abdominal:      General: Abdomen is flat. There is no distension.      Palpations: Abdomen is soft.      Tenderness: There is abdominal tenderness. There is no right CVA tenderness, left CVA tenderness or guarding.   Skin:     General: Skin is warm.   Neurological:      Mental Status: She is alert.   Psychiatric:         Mood and Affect: Mood normal.         Laboratory Results:  Labs Reviewed   CBC AND DIFF   COMPREHENSIVE METABOLIC PANEL   LIPASE   MAGNESIUM   POC URINE DIPSTICK AUTO READ          Radiology Interpretation:  No orders to display       EKG:      Medical Decision Making:  Tanya French is a 57 y.o. female who presents with chief complaint as listed above. Based on the history and presentation, the list of differential diagnoses considered included, but was not limited to, gastritis, pancreatitis, gastroenteritis, cystitis, diverticulitis/diverticulosis, constipation, sequelae of previously diagnosed IBD?    ED Course  Patient is afebrile, and mildly hypertensive on presentation to 132/109.  Discussed with patient further lab work and symptom control.  Dr. Maisie Fus discussed with her that she would not receive morphine but other medications for symptom control.  Patient was upset at this and stated that she wanted to leave before workup was complete.    Lab work and medications discontinued.  Patient left AMA.           Complexity of Problems Addressed  Patient's active diagnoses as well as contributing pre-existing medical problems  include:  Clinical Impression   Generalized abdominal pain          Additional data reviewed:    History was obtained from an independent historian: Not in addition to what is mentioned above  Prior non-ED notes reviewed: Recent discharge summary and/or H&P and Outside ED record  Independent interpretation of diagnostic tests was performed by me: Not in addition to what is mentioned above  Patient presentation/management was discussed with the following qualified health care professionals and/or other relevant professionals: Not in addition to what is mentioned above    Risk evaluation:    Diagnosis or treatment of patient condition impacted by social determinant of health: Insurance access and Impacted by substance use  Tests Considered but not performed due to clinical scoring (if not mentioned in ED course, aside from what is implied by clinical scores listed): N/A  Rationale regarding whether admission or escalation of care considered if not performed (if not mentioned in ED course, aside from what is implied by clinical scores listed): N/A    ED Scoring:                                Facility Administered Meds:  Medications   alum-mag hydroxide-simeth (MYLANTA) oral suspension 30 mL (has no administration in time range)     And   lidocaine hcl viscous 2 % solution 15 mL (has no administration in time range)   famotidine (PEPCID) tablet 40 mg (has no administration in time range)   ondansetron (ZOFRAN ODT) rapid dissolve tablet 4 mg (has no administration in time range)       Clinical Impression:  Clinical Impression   Generalized abdominal pain       Disposition/Follow up  ED Disposition     ED Disposition   AMA           Emergency Department: [place], [place]   [address]  [address]  [place] [address]  [phone]  Go to   As needed, If symptoms worsen      Medications:  New Prescriptions    No medications on file       Procedure Notes:  Procedures       Earnest Bailey, MD    Attestation / Supervision:    Attestation / Supervision Note concerning Merleen Milliner Wilshire: I personally performed the key portions of the E/M visit, discussed case with resident and concur with resident documentation of history, physical exam, assessment, and treatment plan unless otherwise noted.    I had a discussion with Ms. Dass. I reviewed record of visit from [facility] yesterday. She is having generalized pain and reports vomiting blood and has blood in stool. When I evaluated her a chaperone was present. I told Tysha that we would obtain labs and start with protonix, GI cocktail for symptoms. She does not understand why I won't prescribe morphine. I advised her that we would start with initial treatments and if the work up reveals causes for pain, we could escalate pain meds. She believes she is being treated like a drug seeker. I asked about her PCP and also if she has seen pain management as she frequents the ER. She reports she does not have chronic pain. I offered labs and continued work up but she took off her monitors in the middle of our conversation.     I reviewed records and I was able to count 8  Abdominal CTs from this year and [facility] visit in September reports 24 CTs total this year. She also had recent EGD that was unrevealing during recent admission. I again offered to work up with labs and treating the underlying symptoms here in the ED but she declined. Would recommend follow up with PCP and possible pain management referral. She left prior to labs.     Carleene Mains, MD

## 2022-12-19 NOTE — ED Notes
Patient discussed plan for care with Dr. Maisie Fus.  At this time she elected to leave as she did not feel that her needs will be addressed.  Encouraged patient to return if she would like further assessment.  Alert and oriented, patient ambulated off unit.

## 2023-03-13 ENCOUNTER — Encounter: Admit: 2023-03-13 | Discharge: 2023-03-13 | Payer: MEDICAID

## 2023-03-13 ENCOUNTER — Emergency Department
Admit: 2023-03-13 | Discharge: 2023-03-13 | Discharge status: Against Medical Advice | Payer: MEDICAID | Attending: Family

## 2023-03-13 ENCOUNTER — Emergency Department: Admit: 2023-03-13 | Discharge: 2023-03-13 | Payer: MEDICAID | Attending: Family

## 2023-08-27 ENCOUNTER — Encounter: Admit: 2023-08-27 | Discharge: 2023-08-27

## 2023-09-06 ENCOUNTER — Encounter: Admit: 2023-09-06 | Discharge: 2023-09-06

## 2023-09-23 ENCOUNTER — Encounter: Admit: 2023-09-23 | Discharge: 2023-09-23 | Payer: MEDICAID

## 2023-09-27 ENCOUNTER — Encounter: Admit: 2023-09-27 | Discharge: 2023-09-27 | Payer: MEDICAID

## 2024-04-02 ENCOUNTER — Encounter: Admit: 2024-04-02 | Discharge: 2024-04-02 | Payer: MEDICAID

## 2024-04-02 ENCOUNTER — Observation Stay: Admit: 2024-04-02 | Discharge: 2024-04-03 | Payer: MEDICAID

## 2024-04-02 ENCOUNTER — Emergency Department: Admit: 2024-04-02 | Discharge: 2024-04-02 | Payer: MEDICAID

## 2024-04-04 ENCOUNTER — Encounter: Admit: 2024-04-04 | Discharge: 2024-04-04 | Payer: MEDICAID

## 2024-04-04 ENCOUNTER — Observation Stay: Admit: 2024-04-04 | Discharge: 2024-04-05 | Payer: MEDICAID
